# Patient Record
Sex: Female | Born: 1960 | Race: White | Hispanic: No | Marital: Single | Smoking: Former smoker
Health system: Southern US, Community
[De-identification: ages and names within clinical notes are randomized; demographics above are authoritative.]

## PROBLEM LIST (undated history)

## (undated) DIAGNOSIS — I10 Essential (primary) hypertension: Secondary | ICD-10-CM

## (undated) DIAGNOSIS — E785 Hyperlipidemia, unspecified: Secondary | ICD-10-CM

## (undated) HISTORY — DX: Essential (primary) hypertension: I10

## (undated) HISTORY — PX: OTHER SURGICAL HISTORY: SHX169

## (undated) HISTORY — DX: Hyperlipidemia, unspecified: E78.5

---

## 1997-10-22 ENCOUNTER — Emergency Department (HOSPITAL_COMMUNITY): Admission: EM | Admit: 1997-10-22 | Discharge: 1997-10-22 | Payer: Self-pay | Admitting: Emergency Medicine

## 1997-10-23 ENCOUNTER — Emergency Department (HOSPITAL_COMMUNITY): Admission: EM | Admit: 1997-10-23 | Discharge: 1997-10-23 | Payer: Self-pay | Admitting: Emergency Medicine

## 1998-05-31 ENCOUNTER — Emergency Department (HOSPITAL_COMMUNITY): Admission: EM | Admit: 1998-05-31 | Discharge: 1998-05-31 | Payer: Self-pay | Admitting: Emergency Medicine

## 1999-09-20 ENCOUNTER — Encounter: Admission: RE | Admit: 1999-09-20 | Discharge: 1999-09-20 | Payer: Self-pay | Admitting: Obstetrics

## 1999-10-22 ENCOUNTER — Other Ambulatory Visit: Admission: RE | Admit: 1999-10-22 | Discharge: 1999-10-22 | Payer: Self-pay | Admitting: Obstetrics

## 1999-10-22 ENCOUNTER — Encounter (INDEPENDENT_AMBULATORY_CARE_PROVIDER_SITE_OTHER): Payer: Self-pay | Admitting: Specialist

## 1999-12-06 ENCOUNTER — Encounter: Admission: RE | Admit: 1999-12-06 | Discharge: 1999-12-06 | Payer: Self-pay | Admitting: Obstetrics

## 2000-02-26 ENCOUNTER — Emergency Department (HOSPITAL_COMMUNITY): Admission: EM | Admit: 2000-02-26 | Discharge: 2000-02-26 | Payer: Self-pay | Admitting: Emergency Medicine

## 2001-05-20 ENCOUNTER — Encounter: Payer: Self-pay | Admitting: Emergency Medicine

## 2001-05-20 ENCOUNTER — Emergency Department (HOSPITAL_COMMUNITY): Admission: EM | Admit: 2001-05-20 | Discharge: 2001-05-20 | Payer: Self-pay | Admitting: Emergency Medicine

## 2001-10-14 ENCOUNTER — Ambulatory Visit (HOSPITAL_COMMUNITY): Admission: RE | Admit: 2001-10-14 | Discharge: 2001-10-14 | Payer: Self-pay | Admitting: Family Medicine

## 2001-10-14 ENCOUNTER — Encounter: Payer: Self-pay | Admitting: Family Medicine

## 2005-01-07 ENCOUNTER — Emergency Department (HOSPITAL_COMMUNITY): Admission: EM | Admit: 2005-01-07 | Discharge: 2005-01-07 | Payer: Self-pay | Admitting: Emergency Medicine

## 2005-09-03 ENCOUNTER — Ambulatory Visit: Payer: Self-pay | Admitting: Internal Medicine

## 2005-09-18 ENCOUNTER — Encounter: Admission: RE | Admit: 2005-09-18 | Discharge: 2005-09-18 | Payer: Self-pay | Admitting: Internal Medicine

## 2006-01-28 ENCOUNTER — Other Ambulatory Visit: Admission: RE | Admit: 2006-01-28 | Discharge: 2006-01-28 | Payer: Self-pay | Admitting: Obstetrics and Gynecology

## 2006-06-05 ENCOUNTER — Ambulatory Visit: Payer: Self-pay | Admitting: Internal Medicine

## 2006-06-10 ENCOUNTER — Ambulatory Visit: Payer: Self-pay | Admitting: Internal Medicine

## 2006-06-10 LAB — CONVERTED CEMR LAB
ALT: 13 units/L (ref 0–40)
Alkaline Phosphatase: 60 units/L (ref 39–117)
BUN: 12 mg/dL (ref 6–23)
Basophils Relative: 0.8 % (ref 0.0–1.0)
CO2: 30 meq/L (ref 19–32)
Calcium: 8.7 mg/dL (ref 8.4–10.5)
Cholesterol: 243 mg/dL (ref 0–200)
Creatinine, Ser: 0.7 mg/dL (ref 0.4–1.2)
Crystals: NEGATIVE
Direct LDL: 164.7 mg/dL
Eosinophils Relative: 1.6 % (ref 0.0–5.0)
GFR calc Af Amer: 117 mL/min
Glucose, Bld: 104 mg/dL — ABNORMAL HIGH (ref 70–99)
HCT: 39.9 % (ref 36.0–46.0)
Hemoglobin, Urine: NEGATIVE
Hemoglobin: 14.2 g/dL (ref 12.0–15.0)
Ketones, ur: NEGATIVE mg/dL
Leukocytes, UA: NEGATIVE
Lymphocytes Relative: 35 % (ref 12.0–46.0)
Monocytes Absolute: 0.4 10*3/uL (ref 0.2–0.7)
Monocytes Relative: 6.1 % (ref 3.0–11.0)
Mucus, UA: NEGATIVE
Neutro Abs: 3.7 10*3/uL (ref 1.4–7.7)
Neutrophils Relative %: 56.5 % (ref 43.0–77.0)
Potassium: 4.2 meq/L (ref 3.5–5.1)
RDW: 11.7 % (ref 11.5–14.6)
TSH: 1.31 microintl units/mL (ref 0.35–5.50)
Total Protein, Urine: NEGATIVE mg/dL
Urine Glucose: NEGATIVE mg/dL
Urobilinogen, UA: 0.2 (ref 0.0–1.0)
VLDL: 28 mg/dL (ref 0–40)
WBC: 6.6 10*3/uL (ref 4.5–10.5)

## 2006-06-13 ENCOUNTER — Emergency Department (HOSPITAL_COMMUNITY): Admission: EM | Admit: 2006-06-13 | Discharge: 2006-06-13 | Payer: Self-pay | Admitting: Emergency Medicine

## 2006-06-17 ENCOUNTER — Ambulatory Visit: Payer: Self-pay | Admitting: Internal Medicine

## 2006-08-28 ENCOUNTER — Emergency Department (HOSPITAL_COMMUNITY): Admission: EM | Admit: 2006-08-28 | Discharge: 2006-08-29 | Payer: Self-pay | Admitting: Emergency Medicine

## 2006-09-15 ENCOUNTER — Ambulatory Visit: Payer: Self-pay | Admitting: Internal Medicine

## 2006-09-16 ENCOUNTER — Ambulatory Visit: Payer: Self-pay | Admitting: Internal Medicine

## 2006-09-16 LAB — CONVERTED CEMR LAB
AST: 15 units/L (ref 0–37)
Cholesterol: 229 mg/dL (ref 0–200)
Direct LDL: 153.8 mg/dL
Total CHOL/HDL Ratio: 4.2

## 2006-10-21 ENCOUNTER — Ambulatory Visit (HOSPITAL_COMMUNITY): Admission: RE | Admit: 2006-10-21 | Discharge: 2006-10-21 | Payer: Self-pay | Admitting: Internal Medicine

## 2007-07-28 ENCOUNTER — Telehealth: Payer: Self-pay | Admitting: Internal Medicine

## 2007-09-17 ENCOUNTER — Ambulatory Visit: Payer: Self-pay | Admitting: Endocrinology

## 2007-09-17 DIAGNOSIS — M545 Low back pain: Secondary | ICD-10-CM

## 2007-09-17 DIAGNOSIS — J309 Allergic rhinitis, unspecified: Secondary | ICD-10-CM | POA: Insufficient documentation

## 2007-10-13 ENCOUNTER — Encounter: Payer: Self-pay | Admitting: Internal Medicine

## 2007-11-30 ENCOUNTER — Telehealth: Payer: Self-pay | Admitting: Internal Medicine

## 2007-12-08 ENCOUNTER — Ambulatory Visit (HOSPITAL_COMMUNITY): Admission: RE | Admit: 2007-12-08 | Discharge: 2007-12-08 | Payer: Self-pay | Admitting: Internal Medicine

## 2009-03-27 ENCOUNTER — Ambulatory Visit (HOSPITAL_COMMUNITY): Admission: RE | Admit: 2009-03-27 | Discharge: 2009-03-27 | Payer: Self-pay | Admitting: Internal Medicine

## 2010-12-12 ENCOUNTER — Other Ambulatory Visit (HOSPITAL_COMMUNITY): Payer: Self-pay | Admitting: Family Medicine

## 2010-12-12 DIAGNOSIS — Z1231 Encounter for screening mammogram for malignant neoplasm of breast: Secondary | ICD-10-CM

## 2010-12-20 ENCOUNTER — Ambulatory Visit (HOSPITAL_COMMUNITY): Payer: Self-pay

## 2010-12-24 ENCOUNTER — Ambulatory Visit (HOSPITAL_COMMUNITY)
Admission: RE | Admit: 2010-12-24 | Discharge: 2010-12-24 | Disposition: A | Discharge status: Home / Self Care | Payer: BC Managed Care – PPO | Source: Ambulatory Visit | Attending: Family Medicine | Admitting: Family Medicine

## 2010-12-24 DIAGNOSIS — Z1231 Encounter for screening mammogram for malignant neoplasm of breast: Secondary | ICD-10-CM | POA: Insufficient documentation

## 2010-12-27 ENCOUNTER — Other Ambulatory Visit: Payer: Self-pay | Admitting: Family Medicine

## 2010-12-27 DIAGNOSIS — R928 Other abnormal and inconclusive findings on diagnostic imaging of breast: Secondary | ICD-10-CM

## 2011-01-22 ENCOUNTER — Ambulatory Visit
Admission: RE | Admit: 2011-01-22 | Discharge: 2011-01-22 | Disposition: A | Discharge status: Home / Self Care | Payer: BC Managed Care – PPO | Source: Ambulatory Visit | Attending: Family Medicine | Admitting: Family Medicine

## 2011-01-22 DIAGNOSIS — R928 Other abnormal and inconclusive findings on diagnostic imaging of breast: Secondary | ICD-10-CM

## 2012-03-30 ENCOUNTER — Other Ambulatory Visit: Payer: Self-pay | Admitting: Radiation Therapy

## 2012-10-27 ENCOUNTER — Other Ambulatory Visit (HOSPITAL_COMMUNITY): Payer: Self-pay | Admitting: Family Medicine

## 2012-10-27 DIAGNOSIS — Z1231 Encounter for screening mammogram for malignant neoplasm of breast: Secondary | ICD-10-CM

## 2012-11-04 ENCOUNTER — Ambulatory Visit (HOSPITAL_COMMUNITY)
Admission: RE | Admit: 2012-11-04 | Discharge: 2012-11-04 | Disposition: A | Discharge status: Home / Self Care | Payer: BC Managed Care – PPO | Source: Ambulatory Visit | Attending: Family Medicine | Admitting: Family Medicine

## 2012-11-04 DIAGNOSIS — Z1231 Encounter for screening mammogram for malignant neoplasm of breast: Secondary | ICD-10-CM | POA: Insufficient documentation

## 2015-02-21 ENCOUNTER — Other Ambulatory Visit (HOSPITAL_COMMUNITY): Payer: Self-pay | Admitting: *Deleted

## 2015-02-21 DIAGNOSIS — N644 Mastodynia: Secondary | ICD-10-CM

## 2015-03-16 ENCOUNTER — Ambulatory Visit (HOSPITAL_COMMUNITY)
Admission: RE | Admit: 2015-03-16 | Discharge: 2015-03-16 | Disposition: A | Discharge status: Home / Self Care | Payer: Self-pay | Source: Ambulatory Visit | Attending: Obstetrics and Gynecology | Admitting: Obstetrics and Gynecology

## 2015-03-16 ENCOUNTER — Ambulatory Visit
Admission: RE | Admit: 2015-03-16 | Discharge: 2015-03-16 | Disposition: A | Discharge status: Home / Self Care | Payer: No Typology Code available for payment source | Source: Ambulatory Visit | Attending: Obstetrics and Gynecology | Admitting: Obstetrics and Gynecology

## 2015-03-16 ENCOUNTER — Encounter (HOSPITAL_COMMUNITY): Payer: Self-pay

## 2015-03-16 VITALS — BP 120/76 | Temp 98.2°F | Ht 62.0 in | Wt 168.0 lb

## 2015-03-16 DIAGNOSIS — Z1239 Encounter for other screening for malignant neoplasm of breast: Secondary | ICD-10-CM

## 2015-03-16 DIAGNOSIS — N644 Mastodynia: Secondary | ICD-10-CM

## 2015-03-16 NOTE — Progress Notes (Signed)
Complaints of tingling in left outer breast x 5-6 months that patient states comes and goes.  Pap Smear:  Pap smear not completed today. Last Pap smear was in August 2015 at Dr. Rebecka ApleyMysinger's office and normal per patient. Per patient has a history of an abnormal Pap smear 10 years that required a colposcopy for follow-up. Patient states she has had at least 3 normal Pap smears since colposcopy. No Pap smear results in EPIC.  Physical exam: Breasts Left breast slightly larger than right breast. No skin abnormalities bilateral breasts. No nipple retraction bilateral breasts. No nipple discharge bilateral breasts. No lymphadenopathy. No lumps palpated bilateral breasts. Complaints of left outer breast tenderness on exam. [facility]. Appointment scheduled for Thursday, March 16, 2015 at 1550.       Pelvic/Bimanual No Pap smear completed today since last Pap smear was in August 2015 per patient. Pap smear not indicated per BCCCP guidelines.

## 2015-03-16 NOTE — Patient Instructions (Signed)
Educational materials on self breast awareness given. Explained to  Stacy Hatfield that she did not need a Pap smear today due to last Pap smear was in August 2015 per patient. Let her know BCCCP will cover Pap smears every 3 years unless has a history of abnormal Pap smears. [facility]. Appointment scheduled for Thursday, March 16, 2015 at 1550. Patient aware of appointment and will be there.  Stacy Hatfield verbalized understanding.  Anya Murphey, Kathaleen Maserhristine Poll, RN 3:59 PM

## 2016-07-03 ENCOUNTER — Emergency Department (HOSPITAL_COMMUNITY)
Admission: EM | Admit: 2016-07-03 | Discharge: 2016-07-03 | Disposition: A | Discharge status: Home / Self Care | Payer: No Typology Code available for payment source | Attending: Emergency Medicine | Admitting: Emergency Medicine

## 2016-07-03 ENCOUNTER — Encounter (HOSPITAL_COMMUNITY): Payer: Self-pay | Admitting: Emergency Medicine

## 2016-07-03 DIAGNOSIS — Y92410 Unspecified street and highway as the place of occurrence of the external cause: Secondary | ICD-10-CM | POA: Insufficient documentation

## 2016-07-03 DIAGNOSIS — M545 Low back pain: Secondary | ICD-10-CM | POA: Insufficient documentation

## 2016-07-03 DIAGNOSIS — Y999 Unspecified external cause status: Secondary | ICD-10-CM | POA: Insufficient documentation

## 2016-07-03 DIAGNOSIS — M25512 Pain in left shoulder: Secondary | ICD-10-CM | POA: Insufficient documentation

## 2016-07-03 DIAGNOSIS — Y939 Activity, unspecified: Secondary | ICD-10-CM | POA: Diagnosis not present

## 2016-07-03 DIAGNOSIS — M7918 Myalgia, other site: Secondary | ICD-10-CM

## 2016-07-03 MED ORDER — NAPROXEN 500 MG PO TABS: 500.0000 mg | ORAL_TABLET | Freq: Two times a day (BID) | ORAL | 0 refills | Status: DC

## 2016-07-03 MED ORDER — METHOCARBAMOL 500 MG PO TABS
500.0000 mg | ORAL_TABLET | Freq: Once | ORAL | Status: AC
  Administered 2016-07-03: 500 mg via ORAL
  Filled 2016-07-03: qty 1

## 2016-07-03 MED ORDER — METHOCARBAMOL 500 MG PO TABS: 500.0000 mg | ORAL_TABLET | Freq: Two times a day (BID) | ORAL | 0 refills | Status: DC

## 2016-07-03 MED ORDER — IBUPROFEN 200 MG PO TABS
600.0000 mg | ORAL_TABLET | Freq: Once | ORAL | Status: AC
  Administered 2016-07-03: 600 mg via ORAL
  Filled 2016-07-03: qty 3
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
## 2016-07-03 NOTE — ED Notes (Signed)
Pt ambulatory and independent at discharge.  Verbalized understanding of discharge instructions 

## 2016-07-07 ENCOUNTER — Emergency Department (HOSPITAL_COMMUNITY): Payer: No Typology Code available for payment source

## 2016-07-07 ENCOUNTER — Encounter (HOSPITAL_COMMUNITY): Payer: Self-pay | Admitting: *Deleted

## 2016-07-07 ENCOUNTER — Emergency Department (HOSPITAL_COMMUNITY)
Admission: EM | Admit: 2016-07-07 | Discharge: 2016-07-07 | Disposition: A | Discharge status: Home / Self Care | Payer: No Typology Code available for payment source | Attending: Emergency Medicine | Admitting: Emergency Medicine

## 2016-07-07 DIAGNOSIS — G44209 Tension-type headache, unspecified, not intractable: Secondary | ICD-10-CM | POA: Diagnosis not present

## 2016-07-07 DIAGNOSIS — H8111 Benign paroxysmal vertigo, right ear: Secondary | ICD-10-CM | POA: Insufficient documentation

## 2016-07-07 DIAGNOSIS — Z79899 Other long term (current) drug therapy: Secondary | ICD-10-CM | POA: Insufficient documentation

## 2016-07-07 DIAGNOSIS — M542 Cervicalgia: Secondary | ICD-10-CM | POA: Insufficient documentation

## 2016-07-07 DIAGNOSIS — M545 Low back pain, unspecified: Secondary | ICD-10-CM

## 2016-07-07 DIAGNOSIS — M546 Pain in thoracic spine: Secondary | ICD-10-CM | POA: Diagnosis not present

## 2016-07-07 DIAGNOSIS — Y939 Activity, unspecified: Secondary | ICD-10-CM | POA: Diagnosis not present

## 2016-07-07 DIAGNOSIS — R52 Pain, unspecified: Secondary | ICD-10-CM

## 2016-07-07 DIAGNOSIS — Y92410 Unspecified street and highway as the place of occurrence of the external cause: Secondary | ICD-10-CM | POA: Diagnosis not present

## 2016-07-07 DIAGNOSIS — Y999 Unspecified external cause status: Secondary | ICD-10-CM | POA: Diagnosis not present

## 2016-07-07 MED ORDER — MECLIZINE HCL 32 MG PO TABS: 32.0000 mg | ORAL_TABLET | Freq: Three times a day (TID) | ORAL | 0 refills | Status: DC | PRN
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
## 2017-01-08 ENCOUNTER — Ambulatory Visit: Payer: Self-pay | Admitting: Internal Medicine

## 2017-04-24 ENCOUNTER — Emergency Department (HOSPITAL_COMMUNITY)
Admission: EM | Admit: 2017-04-24 | Discharge: 2017-04-24 | Disposition: A | Discharge status: Home / Self Care | Payer: Self-pay | Attending: Emergency Medicine | Admitting: Emergency Medicine

## 2017-04-24 ENCOUNTER — Other Ambulatory Visit: Payer: Self-pay

## 2017-04-24 ENCOUNTER — Encounter (HOSPITAL_COMMUNITY): Payer: Self-pay | Admitting: Emergency Medicine

## 2017-04-24 DIAGNOSIS — Y929 Unspecified place or not applicable: Secondary | ICD-10-CM | POA: Insufficient documentation

## 2017-04-24 DIAGNOSIS — S0502XA Injury of conjunctiva and corneal abrasion without foreign body, left eye, initial encounter: Secondary | ICD-10-CM | POA: Insufficient documentation

## 2017-04-24 DIAGNOSIS — Y939 Activity, unspecified: Secondary | ICD-10-CM | POA: Insufficient documentation

## 2017-04-24 DIAGNOSIS — Y999 Unspecified external cause status: Secondary | ICD-10-CM | POA: Insufficient documentation

## 2017-04-24 DIAGNOSIS — W500XXA Accidental hit or strike by another person, initial encounter: Secondary | ICD-10-CM | POA: Insufficient documentation

## 2017-04-24 MED ORDER — TETRACAINE HCL 0.5 % OP SOLN
OPHTHALMIC | Status: AC
  Administered 2017-04-24: 04:00:00
  Filled 2017-04-24: qty 4

## 2017-04-24 MED ORDER — FLUORESCEIN SODIUM 1 MG OP STRP
ORAL_STRIP | OPHTHALMIC | Status: AC
  Administered 2017-04-24: 04:00:00
  Filled 2017-04-24: qty 1

## 2017-04-24 MED ORDER — ERYTHROMYCIN 5 MG/GM OP OINT: TOPICAL_OINTMENT | OPHTHALMIC | 0 refills | Status: DC

## 2017-04-24 MED ORDER — IBUPROFEN 200 MG PO TABS
600.0000 mg | ORAL_TABLET | Freq: Once | ORAL | Status: AC
  Administered 2017-04-24: 600 mg via ORAL
  Filled 2017-04-24: qty 3

## 2017-04-24 NOTE — Discharge Instructions (Signed)
You do have a signs of an abrasion on your eye.  Use antibiotic ointment as prescribed.  Warm compresses.  Motrin and Tylenol for pain.  Follow-up with the ophthalmologist on Monday if symptoms not improving.  Return to the ED with any worsening symptoms.

## 2017-04-24 NOTE — ED Notes (Signed)
Visual acuity: both eyes:20/25, left eye:20/30, right eye:20/30. Pt. Has no corrective lenses.
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
## 2019-04-08 ENCOUNTER — Emergency Department (HOSPITAL_COMMUNITY)
Admission: EM | Admit: 2019-04-08 | Discharge: 2019-04-08 | Disposition: A | Discharge status: Home / Self Care | Payer: Self-pay | Attending: Emergency Medicine | Admitting: Emergency Medicine

## 2019-04-08 ENCOUNTER — Emergency Department (HOSPITAL_COMMUNITY): Payer: Self-pay

## 2019-04-08 ENCOUNTER — Encounter (HOSPITAL_COMMUNITY): Payer: Self-pay

## 2019-04-08 ENCOUNTER — Other Ambulatory Visit: Payer: Self-pay

## 2019-04-08 DIAGNOSIS — Z79899 Other long term (current) drug therapy: Secondary | ICD-10-CM | POA: Insufficient documentation

## 2019-04-08 DIAGNOSIS — Z791 Long term (current) use of non-steroidal anti-inflammatories (NSAID): Secondary | ICD-10-CM | POA: Insufficient documentation

## 2019-04-08 DIAGNOSIS — R6889 Other general symptoms and signs: Secondary | ICD-10-CM

## 2019-04-08 DIAGNOSIS — U071 COVID-19: Secondary | ICD-10-CM | POA: Insufficient documentation

## 2019-04-08 LAB — BASIC METABOLIC PANEL
Anion gap: 10 (ref 5–15)
BUN: 12 mg/dL (ref 6–20)
CO2: 28 mmol/L (ref 22–32)
Calcium: 8.8 mg/dL — ABNORMAL LOW (ref 8.9–10.3)
Chloride: 103 mmol/L (ref 98–111)
Creatinine, Ser: 0.64 mg/dL (ref 0.44–1.00)
GFR calc Af Amer: >60 mL/min (ref >60)
GFR calc non Af Amer: >60 mL/min (ref >60)
Glucose, Bld: 97 mg/dL (ref 70–99)
Potassium: 4.5 mmol/L (ref 3.5–5.1)
Sodium: 141 mmol/L (ref 135–145)

## 2019-04-08 LAB — CBC WITH DIFFERENTIAL/PLATELET
Abs Immature Granulocytes: 0.01 K/uL (ref 0.00–0.07)
Basophils Absolute: 0 K/uL (ref 0.0–0.1)
Basophils Relative: 1 %
Eosinophils Absolute: 0 K/uL (ref 0.0–0.5)
Eosinophils Relative: 0 %
HCT: 44.8 % (ref 36.0–46.0)
Hemoglobin: 14.2 g/dL (ref 12.0–15.0)
Immature Granulocytes: 0 %
Lymphocytes Relative: 35 %
Lymphs Abs: 1.5 K/uL (ref 0.7–4.0)
MCH: 30.5 pg (ref 26.0–34.0)
MCHC: 31.7 g/dL (ref 30.0–36.0)
MCV: 96.1 fL (ref 80.0–100.0)
Monocytes Absolute: 0.6 K/uL (ref 0.1–1.0)
Monocytes Relative: 14 %
Neutro Abs: 2.2 K/uL (ref 1.7–7.7)
Neutrophils Relative %: 50 %
Platelets: 201 K/uL (ref 150–400)
RBC: 4.66 MIL/uL (ref 3.87–5.11)
RDW: 12.7 % (ref 11.5–15.5)
WBC: 4.3 K/uL (ref 4.0–10.5)
nRBC: 0 % (ref 0.0–0.2)

## 2019-04-08 MED ORDER — ACETAMINOPHEN 500 MG PO TABS
1000.0000 mg | ORAL_TABLET | Freq: Once | ORAL | Status: AC
  Administered 2019-04-08: 1000 mg via ORAL
  Filled 2019-04-08: qty 2

## 2019-04-08 NOTE — ED Provider Notes (Signed)
[facility]-EMERGENCY DEPT Provider Note   CSN: [id number] Arrival date & time: 04/08/19  0944     History   Chief Complaint Chief Complaint  Patient presents with  . Generalized Body Aches  . Fever    [name] is a 58 y.o. female.     The history is provided by the patient.  Fever Temp source:  Subjective Severity:  Moderate Onset quality:  Gradual Timing:  Intermittent Progression:  Waxing and waning Chronicity:  New Relieved by:  Acetaminophen Worsened by:  Nothing Associated symptoms: chills and myalgias   Associated symptoms: no chest pain, no cough, no dysuria, no ear pain, no rash, no sore throat and no vomiting   Risk factors: no sick contacts     History reviewed. No pertinent past medical history.  Patient Active Problem List   Diagnosis Date Noted  . ALLERGIC RHINITIS CAUSE UNSPECIFIED 09/17/2007  . BACK PAIN, LUMBAR 09/17/2007    History reviewed. No pertinent surgical history.   OB History    Gravida  1   Para  1   Term  1   Preterm      AB      Living  1     SAB      TAB      Ectopic      Multiple      Live Births               Home Medications    Prior to Admission medications   Medication Sig Start Date End Date Taking? Authorizing Provider  erythromycin ophthalmic ointment Place a 1/2 inch ribbon of ointment into the lower eyelid 4 times per day. 04/24/17   Rise Mu, PA-C  meclizine (ANTIVERT) 32 MG tablet Take 1 tablet (32 mg total) by mouth 3 (three) times daily as needed. 07/07/16   Georgiana Shore, PA-C  methocarbamol (ROBAXIN) 500 MG tablet Take 1 tablet (500 mg total) by mouth 2 (two) times daily. 07/03/16   Audry Pili, PA-C  naproxen (NAPROSYN) 500 MG tablet Take 1 tablet (500 mg total) by mouth 2 (two) times daily. 07/03/16   Audry Pili, PA-C    Family History Family History  Problem Relation Age of Onset  . Hypertension Mother   . Stroke Father     Social  History Social History   Tobacco Use  . Smoking status: Never Smoker  . Smokeless tobacco: Never Used  Substance Use Topics  . Alcohol use: No  . Drug use: No     Allergies   Patient has no known allergies.   Review of Systems Review of Systems  Constitutional: Positive for chills and fever.  HENT: Negative for ear pain and sore throat.   Eyes: Negative for pain and visual disturbance.  Respiratory: Negative for cough and shortness of breath.   Cardiovascular: Negative for chest pain and palpitations.  Gastrointestinal: Negative for abdominal pain and vomiting.  Genitourinary: Negative for dysuria and hematuria.  Musculoskeletal: Positive for myalgias. Negative for arthralgias and back pain.  Skin: Negative for color change and rash.  Neurological: Negative for seizures and syncope.  All other systems reviewed and are negative.    Physical Exam Updated Vital Signs  ED Triage Vitals  Enc Vitals Group     BP 04/08/19 0949 (!) 154/86     Pulse Rate 04/08/19 0949 78     Resp 04/08/19 0949 18     Temp 04/08/19 0949 100 F (37.8  C)     Temp Source 04/08/19 0949 Oral     SpO2 04/08/19 0949 100 %     Weight 04/08/19 1000 165 lb (74.8 kg)     Height 04/08/19 1000 5\' 1"  (1.549 m)     Head Circumference --      Peak Flow --      Pain Score 04/08/19 1000 8     Pain Loc --      Pain Edu? --      Excl. in GC? --     Physical Exam Vitals signs and nursing note reviewed.  Constitutional:      General: She is not in acute distress.    Appearance: She is well-developed. She is not ill-appearing.  HENT:     Head: Normocephalic and atraumatic.     Nose: Nose normal.     Mouth/Throat:     Mouth: Mucous membranes are moist.  Eyes:     Extraocular Movements: Extraocular movements intact.     Conjunctiva/sclera: Conjunctivae normal.     Pupils: Pupils are equal, round, and reactive to light.  Neck:     Musculoskeletal: Normal range of motion and neck supple.   Cardiovascular:     Rate and Rhythm: Normal rate and regular rhythm.     Pulses: Normal pulses.     Heart sounds: No murmur.  Pulmonary:     Effort: Pulmonary effort is normal. No respiratory distress.  Abdominal:     Palpations: Abdomen is soft.     Tenderness: There is no abdominal tenderness.  Musculoskeletal: Normal range of motion.        General: No tenderness.  Skin:    General: Skin is warm and dry.     Capillary Refill: Capillary refill takes less than 2 seconds.  Neurological:     General: No focal deficit present.     Mental Status: She is alert.  Psychiatric:        Mood and Affect: Mood normal.      ED Treatments / Results  Labs (all labs ordered are listed, but only abnormal results are displayed) Labs Reviewed  BASIC METABOLIC PANEL - Abnormal; Notable for the following components:      Result Value   Calcium 8.8 (*)    All other components within normal limits  NOVEL CORONAVIRUS, NAA (HOSP ORDER, SEND-OUT TO REF LAB; TAT 18-24 HRS)  CBC WITH DIFFERENTIAL/PLATELET    EKG None  Radiology Dg Chest Portable 1 View  Result Date: 04/08/2019 CLINICAL DATA:  Fever EXAM: PORTABLE CHEST 1 VIEW COMPARISON:  Most recent imaging is from 2008 FINDINGS: The heart size and mediastinal contours are within normal limits. Both lungs are clear. No pleural effusion. The visualized skeletal structures are unremarkable. IMPRESSION: No acute process in the chest. Electronically Signed   By: Guadlupe SpanishPraneil  Patel M.D.   On: 04/08/2019 11:52    Procedures Procedures (including critical care time)  Medications Ordered in ED Medications  acetaminophen (TYLENOL) tablet 1,000 mg (1,000 mg Oral Given 04/08/19 1124)     Initial Impression / Assessment and Plan / ED Course  I have reviewed the triage vital signs and the nursing notes.  Pertinent labs & imaging results that were available during my care of the patient were reviewed by me and considered in my medical decision making  (see chart for details).     Stacy Hatfield is a 58 year old female no significant medical history who presents to the ED with flulike symptoms.  Patient  with unremarkable vitals.  Low-grade temperature.  Fever on and off for the last 3 days with body aches, cough.  No urinary symptoms.  No chest pain, no shortness of breath, no abdominal pain.  Overall is well-appearing.  No significant anemia, electrolyte abnormality, kidney injury.  Suspect possible Covid.  Tested for coronavirus.  Had no signs of respiratory distress.  Chest x-ray showed no signs of pneumonia, pneumothorax, pleural effusion.  Recommend continued use of Tylenol Motrin at home.  Suspect viral process.  Understands self-isolation until coronavirus test is back.  Given strict return precautions.  Discharged in good condition.  This chart was dictated using voice recognition software.  Despite best efforts to proofread,  errors can occur which can change the documentation meaning.  Stacy Hatfield was evaluated in Emergency Department on 04/08/2019 for the symptoms described in the history of present illness. She was evaluated in the context of the global COVID-19 pandemic, which necessitated consideration that the patient might be at risk for infection with the SARS-CoV-2 virus that causes COVID-19. Institutional protocols and algorithms that pertain to the evaluation of patients at risk for COVID-19 are in a state of rapid change based on information released by regulatory bodies including the CDC and federal and state organizations. These policies and algorithms were followed during the patient's care in the ED.   Final Clinical Impressions(s) / ED Diagnoses   Final diagnoses:  Flu-like symptoms    ED Discharge Orders    None       Lennice Sites, DO 04/08/19 1228

## 2019-04-08 NOTE — ED Triage Notes (Signed)
Pt presents with c/o generalized body aches for the past 3 days. Pt is febrile today as well, unknown if she was exposed to anyone with Covid or any other illness.

## 2019-04-08 NOTE — Discharge Instructions (Signed)
Self isolate until you hear back about test.

## 2019-04-09 LAB — NOVEL CORONAVIRUS, NAA (HOSP ORDER, SEND-OUT TO REF LAB; TAT 18-24 HRS): SARS-CoV-2, NAA: DETECTED — AB

## 2020-06-01 DIAGNOSIS — Z20822 Contact with and (suspected) exposure to covid-19: Secondary | ICD-10-CM | POA: Diagnosis not present

## 2020-10-18 ENCOUNTER — Encounter: Payer: Self-pay | Admitting: Nurse Practitioner

## 2020-10-18 ENCOUNTER — Ambulatory Visit: Payer: Self-pay | Attending: Nurse Practitioner | Admitting: Nurse Practitioner

## 2020-10-18 ENCOUNTER — Other Ambulatory Visit: Payer: Self-pay

## 2020-10-18 DIAGNOSIS — Z7689 Persons encountering health services in other specified circumstances: Secondary | ICD-10-CM

## 2020-10-18 NOTE — Progress Notes (Signed)
Virtual Visit via Telephone Note Due to national recommendations of social distancing due to [place] 19, telehealth visit is felt to be most appropriate for this patient at this time.  I discussed the limitations, risks, security and privacy concerns of performing an evaluation and management service by telephone and the availability of in person appointments. I also discussed with the patient that there may be a patient responsible charge related to this service. The patient expressed understanding and agreed to proceed.    I connected with Stacy Hatfield on 10/18/20  at   1:50 PM EDT  EDT by telephone and verified that I am speaking with the correct person using two identifiers.   Consent I discussed the limitations, risks, security and privacy concerns of performing an evaluation and management service by telephone and the availability of in person appointments. I also discussed with the patient that there may be a patient responsible charge related to this service. The patient expressed understanding and agreed to proceed.   Location of Patient: Private Residence   Location of Provider: [place] participating in Telemedicine visit: Geryl Rankins FNP-BC [place] CMA Stacy Hatfield    History of Present Illness: Telemedicine visit for: Establish care History of Present Illness: Telemedicine visit for: Establish Care Patient has been counseled on age-appropriate routine health concerns for screening and prevention. These are reviewed and up-to-date. Referrals have been placed accordingly. Immunizations are up-to-date or declined.    MAMMOGRAM: overdue. Referred to breast clinic. Bilateral nipple pruritis. Denies any palpable masses/lumps PAP Smear: Overdue.  Colonoscopy: Needs stool kit.   She has no significant PMH. Wants to establish care. She has not had a PCP in several years. Denies any significant PMH of HTN, DM, Thyroid disorder.    Denies chest pain, shortness of breath, palpitations, lightheadedness, dizziness, headaches or BLE edema.     History reviewed. No pertinent past medical history.  History reviewed. No pertinent surgical history.  Family History  Problem Relation Age of Onset  . Hypertension Mother   . Stroke Father   . Heart attack Father     Social History   Socioeconomic History  . Marital status: Single    Spouse name: Not on file  . Number of children: Not on file  . Years of education: Not on file  . Highest education level: Not on file  Occupational History  . Not on file  Tobacco Use  . Smoking status: Never Smoker  . Smokeless tobacco: Never Used  Substance and Sexual Activity  . Alcohol use: No  . Drug use: No  . Sexual activity: Not on file  Other Topics Concern  . Not on file  Social History Narrative  . Not on file   Social Determinants of Health   Financial Resource Strain: Not on file  Food Insecurity: Not on file  Transportation Needs: Not on file  Physical Activity: Not on file  Stress: Not on file  Social Connections: Not on file     Observations/Objective: Awake, alert and oriented x 3   Review of Systems  Constitutional: Negative for fever, malaise/fatigue and weight loss.  HENT: Negative.  Negative for nosebleeds.   Eyes: Negative.  Negative for blurred vision, double vision and photophobia.  Respiratory: Negative.  Negative for cough and shortness of breath.   Cardiovascular: Negative.  Negative for chest pain, palpitations and leg swelling.  Gastrointestinal: Negative.  Negative for heartburn, nausea and vomiting.  Musculoskeletal: Negative.  Negative for myalgias.  Neurological: Negative.  Negative for dizziness, focal weakness, seizures and headaches.  Psychiatric/Behavioral: Negative.  Negative for suicidal ideas.    Assessment and Plan: Diagnoses and all orders for this visit:  Encounter to establish care Follow Up Instructions Return for  Physical/PAP.     I discussed the assessment and treatment plan with the patient. The patient was provided an opportunity to ask questions and all were answered. The patient agreed with the plan and demonstrated an understanding of the instructions.   The patient was advised to call back or seek an in-person evaluation if the symptoms worsen or if the condition fails to improve as anticipated.  I provided 14 minutes of non-face-to-face time during this encounter including median intraservice time, reviewing previous notes, labs, imaging, medications and explaining diagnosis and management.  Gildardo Pounds, FNP-BC

## 2020-12-01 ENCOUNTER — Ambulatory Visit: Payer: Self-pay | Attending: Nurse Practitioner | Admitting: Nurse Practitioner

## 2020-12-01 ENCOUNTER — Encounter: Payer: Self-pay | Admitting: Nurse Practitioner

## 2020-12-01 ENCOUNTER — Other Ambulatory Visit (HOSPITAL_COMMUNITY)
Admission: RE | Admit: 2020-12-01 | Discharge: 2020-12-01 | Disposition: A | Discharge status: Home / Self Care | Payer: Medicaid Other | Source: Ambulatory Visit | Attending: Nurse Practitioner | Admitting: Nurse Practitioner

## 2020-12-01 ENCOUNTER — Other Ambulatory Visit: Payer: Self-pay

## 2020-12-01 VITALS — BP 143/81 | HR 61 | Ht 61.0 in | Wt 182.0 lb

## 2020-12-01 DIAGNOSIS — Z01419 Encounter for gynecological examination (general) (routine) without abnormal findings: Secondary | ICD-10-CM | POA: Insufficient documentation

## 2020-12-01 DIAGNOSIS — Z1151 Encounter for screening for human papillomavirus (HPV): Secondary | ICD-10-CM | POA: Insufficient documentation

## 2020-12-01 DIAGNOSIS — Z124 Encounter for screening for malignant neoplasm of cervix: Secondary | ICD-10-CM | POA: Diagnosis present

## 2020-12-01 DIAGNOSIS — Z131 Encounter for screening for diabetes mellitus: Secondary | ICD-10-CM

## 2020-12-01 DIAGNOSIS — Z130 Encounter for screening for diseases of the blood and blood-forming organs and certain disorders involving the immune mechanism: Secondary | ICD-10-CM

## 2020-12-01 DIAGNOSIS — Z1211 Encounter for screening for malignant neoplasm of colon: Secondary | ICD-10-CM

## 2020-12-01 DIAGNOSIS — Z1329 Encounter for screening for other suspected endocrine disorder: Secondary | ICD-10-CM | POA: Diagnosis not present

## 2020-12-01 DIAGNOSIS — Z1159 Encounter for screening for other viral diseases: Secondary | ICD-10-CM | POA: Diagnosis not present

## 2020-12-01 DIAGNOSIS — N649 Disorder of breast, unspecified: Secondary | ICD-10-CM

## 2020-12-01 DIAGNOSIS — Z13220 Encounter for screening for lipoid disorders: Secondary | ICD-10-CM | POA: Diagnosis not present

## 2020-12-01 DIAGNOSIS — I10 Essential (primary) hypertension: Secondary | ICD-10-CM

## 2020-12-01 DIAGNOSIS — Z114 Encounter for screening for human immunodeficiency virus [HIV]: Secondary | ICD-10-CM

## 2020-12-01 DIAGNOSIS — Z1231 Encounter for screening mammogram for malignant neoplasm of breast: Secondary | ICD-10-CM

## 2020-12-01 DIAGNOSIS — Z113 Encounter for screening for infections with a predominantly sexual mode of transmission: Secondary | ICD-10-CM | POA: Insufficient documentation

## 2020-12-01 DIAGNOSIS — Z112 Encounter for screening for other bacterial diseases: Secondary | ICD-10-CM | POA: Diagnosis not present

## 2020-12-01 DIAGNOSIS — L988 Other specified disorders of the skin and subcutaneous tissue: Secondary | ICD-10-CM

## 2020-12-01 MED ORDER — LISINOPRIL 10 MG PO TABS
10.0000 mg | ORAL_TABLET | Freq: Every day | ORAL | 3 refills | Status: DC
  Filled 2020-12-01: qty 30, 30d supply, fill #0
  Filled 2020-12-19: qty 30, 30d supply, fill #1
  Filled 2021-01-31: qty 30, 30d supply, fill #2
  Filled 2021-03-01: qty 30, 30d supply, fill #3
  Filled 2021-03-29: qty 30, 30d supply, fill #4
  Filled 2021-05-10: qty 30, 30d supply, fill #5
  Filled 2021-06-07: qty 30, 30d supply, fill #0
  Filled 2021-06-07: qty 30, 30d supply, fill #6
  Filled 2021-07-04: qty 30, 30d supply, fill #1
  Filled 2021-08-01: qty 30, 30d supply, fill #2

## 2020-12-01 NOTE — Progress Notes (Addendum)
Assessment & Plan:  Stacy Hatfield was seen today for gynecologic exam.  Diagnoses and all orders for this visit:  Encounter for Papanicolaou smear for cervical cancer screening -     Cytology - PAP -     Cervicovaginal ancillary only  Breast cancer screening by mammogram -     MM DIGITAL SCREENING BILATERAL; Future  Encounter for screening for diabetes mellitus -     Hemoglobin A1c  Screening for deficiency anemia -     CBC  Thyroid disorder screening -     Thyroid Panel With TSH  Colon cancer screening -     Fecal occult blood, imunochemical([facility]/Sun[facility])  Serum calcium elevated -     CMP14+EGFR  Lipid screening -     Lipid panel  Encounter for screening for HIV -     HIV antibody (with reflex)  Need for hepatitis C screening test -     HCV Ab w Reflex to Quant PCR  Primary hypertension -     lisinopril (ZESTRIL) 10 MG tablet; Take 1 tablet (10 mg total) by mouth daily.  Skin lesion of breast -     Ambulatory referral to Dermatology   Patient has been counseled on age-appropriate routine health concerns for screening and prevention. These are reviewed and up-to-date. Referrals have been placed accordingly. Immunizations are up-to-date or declined.    Subjective:   Chief Complaint  Patient presents with   Gynecologic Exam   [name] 60 y.o. female presents to office today for wellness exam.   She has no past medical history on file.    Essential Hypertension Poorly controlled. Will start low dose lisinopril 10 mg today. Denies chest pain, shortness of breath, palpitations, lightheadedness, dizziness, headaches or BLE edema.   BP Readings from Last 3 Encounters:  12/01/20 (!) 143/81  04/08/19 (!) 155/76  04/24/17 (!) 166/82    Review of Systems  Constitutional: Negative.  Negative for chills, fever, malaise/fatigue and weight loss.  Respiratory: Negative.  Negative for cough, sputum production, shortness of breath and wheezing.    Cardiovascular: Negative.  Negative for chest pain and leg swelling.  Gastrointestinal: Negative.  Negative for abdominal pain, blood in stool, constipation, diarrhea, heartburn, melena, nausea and vomiting.  Genitourinary: Negative.   Skin: Negative.  Negative for rash.  Neurological: Negative.  Negative for dizziness, tremors, speech change, focal weakness, seizures and headaches.  Psychiatric/Behavioral: Negative.  Negative for depression and suicidal ideas. The patient is not nervous/anxious and does not have insomnia.    History reviewed. No pertinent past medical history.  History reviewed. No pertinent surgical history.  Family History  Problem Relation Age of Onset   Hypertension Mother    Stroke Father    Heart attack Father     Social History Reviewed with no changes to be made today.   No outpatient medications prior to visit.   No facility-administered medications prior to visit.    No Known Allergies     Objective:    BP (!) 143/81   Pulse 61   Ht 5\' 1"  (1.549 m)   Wt 182 lb (82.6 kg)   LMP 10/18/2012   SpO2 98%   BMI 34.39 kg/m  Wt Readings from Last 3 Encounters:  12/01/20 182 lb (82.6 kg)  04/08/19 165 lb (74.8 kg)  07/07/16 175 lb (79.4 kg)    Physical Exam Exam conducted with a chaperone present.  Constitutional:      General: She is not in acute distress.  Appearance: She is well-developed. She is not diaphoretic.  HENT:     Head: Normocephalic and atraumatic.     Right Ear: External ear normal.     Left Ear: External ear normal.     Nose: Nose normal.     Mouth/Throat:     Pharynx: No oropharyngeal exudate.  Eyes:     General: No scleral icterus.       Right eye: No discharge.        Left eye: No discharge.     Conjunctiva/sclera: Conjunctivae normal.     Pupils: Pupils are equal, round, and reactive to light.  Neck:     Thyroid: No thyromegaly.     Trachea: No tracheal deviation.  Cardiovascular:     Rate and Rhythm: Normal  rate and regular rhythm.     Heart sounds: Normal heart sounds. No murmur heard.   No friction rub.  Pulmonary:     Effort: Pulmonary effort is normal. No respiratory distress.     Breath sounds: Normal breath sounds. No decreased breath sounds, wheezing, rhonchi or rales.  Chest:     Chest wall: No tenderness.  Breasts:    Right: No inverted nipple, mass, nipple discharge, skin change or tenderness.     Left: No inverted nipple, mass, nipple discharge, skin change or tenderness.    Abdominal:     General: Bowel sounds are normal. There is no distension.     Palpations: Abdomen is soft. There is no mass.     Tenderness: There is no abdominal tenderness. There is no guarding or rebound.     Hernia: There is no hernia in the left inguinal area.  Genitourinary:    Exam position: Lithotomy position.     Labia:        Right: No rash, tenderness, lesion or injury.        Left: No rash, tenderness, lesion or injury.      Vagina: Normal. No vaginal discharge, erythema, tenderness or bleeding.     Cervix: No cervical motion tenderness, discharge or friability.     Uterus: Not tender.      Adnexa:        Right: No mass, tenderness or fullness.         Left: No mass, tenderness or fullness.       Rectum: No mass, anal fissure or external hemorrhoid. Normal anal tone.  Musculoskeletal:        General: No tenderness or deformity. Normal range of motion.     Cervical back: Normal range of motion and neck supple.  Lymphadenopathy:     Cervical: No cervical adenopathy.  Skin:    General: Skin is warm and dry.     Coloration: Skin is not pale.     Findings: No erythema or rash.  Neurological:     Mental Status: She is alert and oriented to person, place, and time.     Cranial Nerves: No cranial nerve deficit.     Coordination: Coordination normal.  Psychiatric:        Behavior: Behavior normal.        Thought Content: Thought content normal.        Judgment: Judgment normal.          Patient has been counseled extensively about nutrition and exercise as well as the importance of adherence with medications and regular follow-up. The patient was given clear instructions to go to ER or return to medical center if symptoms don't improve,  worsen or new problems develop. The patient verbalized understanding.   Follow-up: Return in about 3 weeks (around 12/22/2020) for BP and BMP with LUKE. see me in 3 months.   Gildardo Pounds, FNP-BC [facility] [place], [place]   12/01/2020, 3:13 PM

## 2020-12-02 LAB — CMP14+EGFR
ALT: 16 IU/L (ref 0–32)
AST: 17 IU/L (ref 0–40)
Albumin/Globulin Ratio: 1.9 (ref 1.2–2.2)
Albumin: 4.5 g/dL (ref 3.8–4.9)
Alkaline Phosphatase: 104 IU/L (ref 44–121)
BUN/Creatinine Ratio: 17 (ref 9–23)
BUN: 11 mg/dL (ref 6–24)
Bilirubin Total: 0.2 mg/dL (ref 0.0–1.2)
CO2: 25 mmol/L (ref 20–29)
Calcium: 9.4 mg/dL (ref 8.7–10.2)
Chloride: 103 mmol/L (ref 96–106)
Creatinine, Ser: 0.63 mg/dL (ref 0.57–1.00)
Globulin, Total: 2.4 g/dL (ref 1.5–4.5)
Glucose: 93 mg/dL (ref 65–99)
Potassium: 4.4 mmol/L (ref 3.5–5.2)
Sodium: 144 mmol/L (ref 134–144)
Total Protein: 6.9 g/dL (ref 6.0–8.5)
eGFR: 103 mL/min/{1.73_m2} (ref >59)

## 2020-12-02 LAB — CBC
Hematocrit: 38.9 % (ref 34.0–46.6)
Hemoglobin: 13.3 g/dL (ref 11.1–15.9)
MCH: 30.9 pg (ref 26.6–33.0)
MCHC: 34.2 g/dL (ref 31.5–35.7)
MCV: 91 fL (ref 79–97)
Platelets: 225 10*3/uL (ref 150–450)
RBC: 4.3 x10E6/uL (ref 3.77–5.28)
RDW: 12.6 % (ref 11.7–15.4)
WBC: 7.3 10*3/uL (ref 3.4–10.8)

## 2020-12-02 LAB — LIPID PANEL
Chol/HDL Ratio: 4.6 ratio — ABNORMAL HIGH (ref 0.0–4.4)
Cholesterol, Total: 255 mg/dL — ABNORMAL HIGH (ref 100–199)
HDL: 56 mg/dL (ref >39)
LDL Chol Calc (NIH): 168 mg/dL — ABNORMAL HIGH (ref 0–99)
Triglycerides: 168 mg/dL — ABNORMAL HIGH (ref 0–149)
VLDL Cholesterol Cal: 31 mg/dL (ref 5–40)

## 2020-12-02 LAB — HCV INTERPRETATION

## 2020-12-02 LAB — THYROID PANEL WITH TSH
Free Thyroxine Index: 2.3 (ref 1.2–4.9)
T3 Uptake Ratio: 31 % (ref 24–39)
T4, Total: 7.5 ug/dL (ref 4.5–12.0)
TSH: 1.18 u[IU]/mL (ref 0.450–4.500)

## 2020-12-02 LAB — HEMOGLOBIN A1C
Est. average glucose Bld gHb Est-mCnc: 123 mg/dL
Hgb A1c MFr Bld: 5.9 % — ABNORMAL HIGH (ref 4.8–5.6)

## 2020-12-02 LAB — HCV AB W REFLEX TO QUANT PCR: HCV Ab: <0.1 s/co ratio (ref 0.0–0.9)

## 2020-12-02 LAB — HIV ANTIBODY (ROUTINE TESTING W REFLEX): HIV Screen 4th Generation wRfx: NONREACTIVE

## 2020-12-04 ENCOUNTER — Other Ambulatory Visit: Payer: Self-pay | Admitting: Nurse Practitioner

## 2020-12-04 LAB — CERVICOVAGINAL ANCILLARY ONLY
Bacterial Vaginitis (gardnerella): POSITIVE — AB
Candida Glabrata: NEGATIVE
Candida Vaginitis: NEGATIVE
Chlamydia: NEGATIVE
Comment: NEGATIVE
Comment: NEGATIVE
Comment: NEGATIVE
Comment: NEGATIVE
Comment: NEGATIVE
Comment: NORMAL
Neisseria Gonorrhea: NEGATIVE
Trichomonas: NEGATIVE

## 2020-12-04 MED ORDER — METRONIDAZOLE 500 MG PO TABS: 500.0000 mg | ORAL_TABLET | Freq: Two times a day (BID) | ORAL | 0 refills | Status: AC

## 2020-12-06 ENCOUNTER — Other Ambulatory Visit: Payer: Self-pay | Admitting: Obstetrics and Gynecology

## 2020-12-06 DIAGNOSIS — Z1231 Encounter for screening mammogram for malignant neoplasm of breast: Secondary | ICD-10-CM

## 2020-12-06 LAB — CYTOLOGY - PAP
Comment: NEGATIVE
Diagnosis: NEGATIVE
High risk HPV: NEGATIVE

## 2020-12-07 LAB — FECAL OCCULT BLOOD, IMMUNOCHEMICAL: Fecal Occult Bld: NEGATIVE

## 2020-12-08 ENCOUNTER — Other Ambulatory Visit: Payer: Self-pay

## 2020-12-08 ENCOUNTER — Ambulatory Visit: Payer: Self-pay | Attending: Nurse Practitioner

## 2020-12-19 ENCOUNTER — Ambulatory Visit: Payer: Medicaid Other | Attending: Nurse Practitioner | Admitting: Pharmacist

## 2020-12-19 ENCOUNTER — Other Ambulatory Visit: Payer: Self-pay

## 2020-12-19 ENCOUNTER — Ambulatory Visit: Payer: Medicaid Other

## 2020-12-19 VITALS — BP 118/71

## 2020-12-19 DIAGNOSIS — I10 Essential (primary) hypertension: Secondary | ICD-10-CM

## 2020-12-19 NOTE — Progress Notes (Signed)
S:    Patient arrives in good spirits. Presents to the clinic for hypertension evaluation, counseling, and management. Patient was referred and last seen by Primary Care Provider on 12/01/2020. At that visit, Stacy Hatfield started lisinopril 10 mg daily.    Medication adherence reported. She has not taken her lisinopril today as she normally takes her medication at bedtime. She did take it last night.   Denies chest pain/dyspnea/HA/dizziness.   Current BP Medications include:  lisinopril 10 mg daily   Dietary habits include: does admit to getting too much sodium in her diet; admits to drinking caffeine daily  Exercise habits include: walks ~1-1.5 miles/3x weekly (usually takes her ~30 minutes) Family / Social history:  -Fhx:  -Tobacco: never smoker  -Alcohol: denies use  O:  Vitals:   12/19/20 1556  BP: 118/71    Home BP readings: none  Last 3 Office BP readings: BP Readings from Last 3 Encounters:  12/19/20 118/71  12/01/20 (!) 143/81  04/08/19 (!) 155/76   BMET    Component Value Date/Time   NA 144 12/01/2020 1510   K 4.4 12/01/2020 1510   CL 103 12/01/2020 1510   CO2 25 12/01/2020 1510   GLUCOSE 93 12/01/2020 1510   GLUCOSE 97 04/08/2019 1116   BUN 11 12/01/2020 1510   CREATININE 0.63 12/01/2020 1510   CALCIUM 9.4 12/01/2020 1510   GFRNONAA >60 04/08/2019 1116   GFRAA >60 04/08/2019 1116   Renal function: CrCl cannot be calculated (Unknown ideal weight.).  Clinical ASCVD: No  The 10-year ASCVD risk score (Goff DC Jr., et al., 2013) is: 3.4%   Values used to calculate the score:     Age: 59 years     Sex: Female     Is Non-Hispanic African American: No     Diabetic: No     Tobacco smoker: No     Systolic Blood Pressure: 118 mmHg     Is BP treated: Yes     HDL Cholesterol: 56 mg/dL     Total Cholesterol: 255 mg/dL  A/P: Hypertension longstanding currently at goal on current medications. BP Goal = < 130/80 mmHg. Medication adherence reported.  -Continued  lisinopril 10 mg daily.  -F/u labs ordered - CMP14+eGFR -Counseled on lifestyle modifications for blood pressure control including reduced dietary sodium, increased exercise, adequate sleep.  Results reviewed and written information provided.   Total time in face-to-face counseling 15 minutes.   F/U Clinic Visit in 1 month.    Luke Van Ausdall, PharmD, BCACP, CPP Clinical Pharmacist [facility] [phone]      

## 2020-12-20 LAB — CMP14+EGFR
ALT: 14 IU/L (ref 0–32)
AST: 15 IU/L (ref 0–40)
Albumin/Globulin Ratio: 1.9 (ref 1.2–2.2)
Albumin: 4.3 g/dL (ref 3.8–4.9)
Alkaline Phosphatase: 99 IU/L (ref 44–121)
BUN/Creatinine Ratio: 24 — ABNORMAL HIGH (ref 9–23)
BUN: 12 mg/dL (ref 6–24)
Bilirubin Total: <0.2 mg/dL (ref 0.0–1.2)
CO2: 24 mmol/L (ref 20–29)
Calcium: 9.3 mg/dL (ref 8.7–10.2)
Chloride: 105 mmol/L (ref 96–106)
Creatinine, Ser: 0.5 mg/dL — ABNORMAL LOW (ref 0.57–1.00)
Globulin, Total: 2.3 g/dL (ref 1.5–4.5)
Glucose: 90 mg/dL (ref 65–99)
Potassium: 4.4 mmol/L (ref 3.5–5.2)
Sodium: 144 mmol/L (ref 134–144)
Total Protein: 6.6 g/dL (ref 6.0–8.5)
eGFR: 109 mL/min/{1.73_m2} (ref >59)

## 2021-01-15 ENCOUNTER — Other Ambulatory Visit: Payer: Self-pay | Admitting: Obstetrics and Gynecology

## 2021-01-15 DIAGNOSIS — Z1231 Encounter for screening mammogram for malignant neoplasm of breast: Secondary | ICD-10-CM

## 2021-01-18 ENCOUNTER — Other Ambulatory Visit: Payer: Self-pay

## 2021-01-18 ENCOUNTER — Ambulatory Visit
Admission: RE | Admit: 2021-01-18 | Discharge: 2021-01-18 | Disposition: A | Discharge status: Home / Self Care | Payer: No Typology Code available for payment source | Source: Ambulatory Visit | Attending: Obstetrics and Gynecology | Admitting: Obstetrics and Gynecology

## 2021-01-18 DIAGNOSIS — Z1231 Encounter for screening mammogram for malignant neoplasm of breast: Secondary | ICD-10-CM

## 2021-01-31 ENCOUNTER — Other Ambulatory Visit: Payer: Self-pay

## 2021-02-01 ENCOUNTER — Other Ambulatory Visit: Payer: Self-pay

## 2021-02-05 ENCOUNTER — Telehealth: Payer: Self-pay | Admitting: Nurse Practitioner

## 2021-02-05 NOTE — Telephone Encounter (Signed)
Copied from CRM [id number]. Topic: Referral - Request for Referral >> Feb 05, 2021  8:41 AM Leafy Ro wrote: has patient seen PCP for this complaint? no *If NO, is insurance requiring patient see PCP for this issue before PCP can refer them? Pt has orange card and was told she needs a referral to see dental Referral for which specialty: dentist Preferred provider/office: Pt does not know the name of dental place just phone number [phone] Reason for referral: tooth broke off last night and cleaning

## 2021-02-05 NOTE — Telephone Encounter (Signed)
Called pt and made an appt for a tele visit for a referral

## 2021-02-09 ENCOUNTER — Encounter: Payer: Self-pay | Admitting: Nurse Practitioner

## 2021-02-09 ENCOUNTER — Ambulatory Visit: Payer: Self-pay | Attending: Nurse Practitioner | Admitting: Nurse Practitioner

## 2021-02-09 ENCOUNTER — Other Ambulatory Visit: Payer: Self-pay

## 2021-02-09 DIAGNOSIS — K0889 Other specified disorders of teeth and supporting structures: Secondary | ICD-10-CM

## 2021-02-09 NOTE — Progress Notes (Signed)
Virtual Visit via Telephone Note Due to national recommendations of social distancing due to COVID 19, telehealth visit is felt to be most appropriate for this patient at this time.  I discussed the limitations, risks, security and privacy concerns of performing an evaluation and management service by telephone and the availability of in person appointments. I also discussed with the patient that there may be a patient responsible charge related to this service. The patient expressed understanding and agreed to proceed.    I connected with Stacy Hatfield on 02/09/21  at   8:10 AM EDT  EDT by telephone and verified that I am speaking with the correct person using two identifiers.  Location of Patient: Private Residence   Location of Provider: [facility]    Persons participating in Telemedicine visit: Stacy Denver FNP-BC Stacy Hatfield    History of Present Illness: Telemedicine visit for: Dental referral  Endorses fractured right upper molar with filling also missing. Also would like dental cleaning. She does not endorse any symptoms of abscess.     Past Medical History:  Diagnosis Date   Hypertension     No past surgical history on file.  Family History  Problem Relation Age of Onset   Hypertension Mother    Stroke Father    Heart attack Father    Breast cancer Neg Hx     Social History   Socioeconomic History   Marital status: Single    Spouse name: Not on file   Number of children: Not on file   Years of education: Not on file   Highest education level: Not on file  Occupational History   Not on file  Tobacco Use   Smoking status: Never   Smokeless tobacco: Never  Substance and Sexual Activity   Alcohol use: No   Drug use: No   Sexual activity: Not on file  Other Topics Concern   Not on file  Social History Narrative   Not on file   Social Determinants of Health   Financial Resource Strain: Not on file  Food  Insecurity: Not on file  Transportation Needs: Not on file  Physical Activity: Not on file  Stress: Not on file  Social Connections: Not on file     Observations/Objective: Awake, alert and oriented x 3   Review of Systems  Constitutional:  Negative for fever, malaise/fatigue and weight loss.  HENT:  Negative for nosebleeds.        SEE HPI  Eyes: Negative.  Negative for blurred vision, double vision and photophobia.  Respiratory: Negative.  Negative for cough and shortness of breath.   Cardiovascular: Negative.  Negative for chest pain, palpitations and leg swelling.  Gastrointestinal: Negative.  Negative for heartburn, nausea and vomiting.  Musculoskeletal: Negative.  Negative for myalgias.  Neurological: Negative.  Negative for dizziness, focal weakness, seizures and headaches.  Psychiatric/Behavioral: Negative.  Negative for suicidal ideas.    Assessment and Plan: Laiana was seen today for dental problem.  Diagnoses and all orders for this visit:  Pain of molar -     Ambulatory referral to Dentistry    Follow Up Instructions Return in about 4 months (around 06/11/2021) for prediabetes.     I discussed the assessment and treatment plan with the patient. The patient was provided an opportunity to ask questions and all were answered. The patient agreed with the plan and demonstrated an understanding of the instructions.   The patient was advised to call back  or seek an in-person evaluation if the symptoms worsen or if the condition fails to improve as anticipated.  I provided 5 minutes of non-face-to-face time during this encounter including median intraservice time, reviewing previous notes, labs, imaging, medications and explaining diagnosis and management.  Claiborne Rigg, FNP-BC

## 2021-02-15 ENCOUNTER — Ambulatory Visit: Payer: Self-pay | Attending: Nurse Practitioner

## 2021-02-15 DIAGNOSIS — Z23 Encounter for immunization: Secondary | ICD-10-CM

## 2021-02-27 ENCOUNTER — Telehealth: Payer: No Typology Code available for payment source | Admitting: Nurse Practitioner

## 2021-03-01 ENCOUNTER — Other Ambulatory Visit: Payer: Self-pay

## 2021-03-05 ENCOUNTER — Ambulatory Visit: Payer: Medicaid Other | Admitting: Nurse Practitioner

## 2021-03-29 ENCOUNTER — Other Ambulatory Visit: Payer: Self-pay

## 2021-05-10 ENCOUNTER — Other Ambulatory Visit: Payer: Self-pay

## 2021-06-07 ENCOUNTER — Other Ambulatory Visit: Payer: Self-pay

## 2021-07-04 ENCOUNTER — Other Ambulatory Visit: Payer: Self-pay

## 2021-07-05 ENCOUNTER — Other Ambulatory Visit: Payer: Self-pay

## 2021-08-01 ENCOUNTER — Other Ambulatory Visit: Payer: Self-pay

## 2021-08-02 ENCOUNTER — Other Ambulatory Visit: Payer: Self-pay

## 2021-08-15 ENCOUNTER — Other Ambulatory Visit: Payer: Self-pay

## 2021-08-15 ENCOUNTER — Ambulatory Visit: Payer: No Typology Code available for payment source | Attending: Nurse Practitioner | Admitting: Nurse Practitioner

## 2021-08-15 ENCOUNTER — Encounter: Payer: Self-pay | Admitting: Nurse Practitioner

## 2021-08-15 VITALS — BP 128/83 | HR 66 | Wt 174.0 lb

## 2021-08-15 DIAGNOSIS — Z79899 Other long term (current) drug therapy: Secondary | ICD-10-CM | POA: Insufficient documentation

## 2021-08-15 DIAGNOSIS — Z23 Encounter for immunization: Secondary | ICD-10-CM | POA: Insufficient documentation

## 2021-08-15 DIAGNOSIS — Z8249 Family history of ischemic heart disease and other diseases of the circulatory system: Secondary | ICD-10-CM | POA: Insufficient documentation

## 2021-08-15 DIAGNOSIS — Z760 Encounter for issue of repeat prescription: Secondary | ICD-10-CM | POA: Insufficient documentation

## 2021-08-15 DIAGNOSIS — I10 Essential (primary) hypertension: Secondary | ICD-10-CM | POA: Insufficient documentation

## 2021-08-15 MED ORDER — LISINOPRIL 10 MG PO TABS
10.0000 mg | ORAL_TABLET | Freq: Every day | ORAL | 3 refills | Status: DC
  Filled 2021-08-15 – 2021-08-30 (×2): qty 90, 90d supply, fill #0

## 2021-08-15 NOTE — Progress Notes (Signed)
? ?Assessment & Plan:  ?Stacy Hatfield was seen today for medication refill and hypertension. ? ?Diagnoses and all orders for this visit: ? ?Primary hypertension ?-     lisinopril (ZESTRIL) 10 MG tablet; Take 1 tablet (10 mg total) by mouth daily. ? ?Need for shingles vaccine ?Administered today  ? ? ?Patient has been counseled on age-appropriate routine health concerns for screening and prevention. These are reviewed and up-to-date. Referrals have been placed accordingly. Immunizations are up-to-date or declined.    ?Subjective:  ? ?Chief Complaint  ?Patient presents with  ? Medication Refill  ? Hypertension  ? ?HPI ?Stacy Hatfield 61 y.o. female presents to office today for follow up to HTN ?She has a past medical history of Hypertension.  ? ?States a few weeks ago she had symptoms of fatigue, decreased appetite, weight loss and overall not feeling well. Symptoms lasted a few days and have completely resolved today.  ? ?Blood pressure is well controlled with lisinopril 10 mg daily.Marland Kitchen  ?BP Readings from Last 3 Encounters:  ?08/15/21 128/83  ?12/19/20 118/71  ?12/01/20 (!) 143/81  ?  ? ?Review of Systems  ?Constitutional:  Negative for fever, malaise/fatigue and weight loss.  ?HENT: Negative.  Negative for nosebleeds.   ?Eyes: Negative.  Negative for blurred vision, double vision and photophobia.  ?Respiratory: Negative.  Negative for cough and shortness of breath.   ?Cardiovascular: Negative.  Negative for chest pain, palpitations and leg swelling.  ?Gastrointestinal: Negative.  Negative for heartburn, nausea and vomiting.  ?Musculoskeletal: Negative.  Negative for myalgias.  ?Neurological: Negative.  Negative for dizziness, focal weakness, seizures and headaches.  ?Psychiatric/Behavioral: Negative.  Negative for suicidal ideas.   ? ?Past Medical History:  ?Diagnosis Date  ? Hypertension   ? ? ?History reviewed. No pertinent surgical history. ? ?Family History  ?Problem Relation Age of Onset  ? Hypertension Mother   ?  Stroke Father   ? Heart attack Father   ? Breast cancer Neg Hx   ? ? ?Social History Reviewed with no changes to be made today.  ? ?Outpatient Medications Prior to Visit  ?Medication Sig Dispense Refill  ? lisinopril (ZESTRIL) 10 MG tablet Take 1 tablet (10 mg total) by mouth daily. 90 tablet 3  ? ?No facility-administered medications prior to visit.  ? ? ?No Known Allergies ? ?   ?Objective:  ?  ?BP 128/83 (BP Location: Left Arm, Cuff Size: Large)   Pulse 66   Wt 174 lb (78.9 kg)   LMP 10/18/2012   SpO2 96%   BMI 32.88 kg/m?  ?Wt Readings from Last 3 Encounters:  ?08/15/21 174 lb (78.9 kg)  ?12/01/20 182 lb (82.6 kg)  ?04/08/19 165 lb (74.8 kg)  ? ? ?Physical Exam ?Vitals and nursing note reviewed.  ?Constitutional:   ?   Appearance: She is well-developed.  ?HENT:  ?   Head: Normocephalic and atraumatic.  ?Cardiovascular:  ?   Rate and Rhythm: Normal rate and regular rhythm.  ?   Heart sounds: Normal heart sounds. No murmur heard. ?  No friction rub. No gallop.  ?Pulmonary:  ?   Effort: Pulmonary effort is normal. No tachypnea or respiratory distress.  ?   Breath sounds: Normal breath sounds. No decreased breath sounds, wheezing, rhonchi or rales.  ?Chest:  ?   Chest wall: No tenderness.  ?Abdominal:  ?   General: Bowel sounds are normal.  ?   Palpations: Abdomen is soft.  ?Musculoskeletal:     ?   General:  Normal range of motion.  ?   Cervical back: Normal range of motion.  ?Skin: ?   General: Skin is warm and dry.  ?Neurological:  ?   Mental Status: She is alert and oriented to person, place, and time.  ?   Coordination: Coordination normal.  ?Psychiatric:     ?   Behavior: Behavior normal. Behavior is cooperative.     ?   Thought Content: Thought content normal.     ?   Judgment: Judgment normal.  ? ? ? ? ?   ?Patient has been counseled extensively about nutrition and exercise as well as the importance of adherence with medications and regular follow-up. The patient was given clear instructions to go to ER  or return to medical center if symptoms don't improve, worsen or new problems develop. The patient verbalized understanding.  ? ?Follow-up: Return for physical/shingles vaccine/A1c/ labs in June .  ? ?Claiborne Rigg, FNP-BC ?[facility] ?[place], [place] ?[phone]   ?08/15/2021, 10:36 PM ?

## 2021-08-30 ENCOUNTER — Other Ambulatory Visit: Payer: Self-pay

## 2021-09-10 ENCOUNTER — Ambulatory Visit: Payer: No Typology Code available for payment source | Admitting: Nurse Practitioner

## 2021-10-19 ENCOUNTER — Other Ambulatory Visit: Payer: Self-pay

## 2021-10-19 ENCOUNTER — Ambulatory Visit: Payer: Medicaid Other | Attending: Nurse Practitioner | Admitting: Nurse Practitioner

## 2021-10-19 ENCOUNTER — Encounter: Payer: Self-pay | Admitting: Nurse Practitioner

## 2021-10-19 VITALS — BP 132/85 | HR 52 | Wt 171.8 lb

## 2021-10-19 DIAGNOSIS — Z0001 Encounter for general adult medical examination with abnormal findings: Secondary | ICD-10-CM

## 2021-10-19 DIAGNOSIS — Z23 Encounter for immunization: Secondary | ICD-10-CM

## 2021-10-19 DIAGNOSIS — Z1211 Encounter for screening for malignant neoplasm of colon: Secondary | ICD-10-CM

## 2021-10-19 DIAGNOSIS — Z0000 Encounter for general adult medical examination without abnormal findings: Secondary | ICD-10-CM

## 2021-10-19 DIAGNOSIS — N6322 Unspecified lump in the left breast, upper inner quadrant: Secondary | ICD-10-CM

## 2021-10-19 DIAGNOSIS — I10 Essential (primary) hypertension: Secondary | ICD-10-CM

## 2021-10-19 DIAGNOSIS — R7303 Prediabetes: Secondary | ICD-10-CM

## 2021-10-19 MED ORDER — LISINOPRIL 20 MG PO TABS
20.0000 mg | ORAL_TABLET | Freq: Every day | ORAL | 3 refills | Status: AC
  Filled 2021-10-19 – 2021-11-07 (×2): qty 90, 90d supply, fill #0
  Filled 2022-01-31: qty 90, 90d supply, fill #1

## 2021-10-19 NOTE — Progress Notes (Signed)
Assessment & Plan:  Stacy Hatfield was seen today for annual exam.  Diagnoses and all orders for this visit:  Encounter for annual physical exam -     CMP14+EGFR REPEAT BMP in 2-3 weeks -     Hemoglobin A1c -     Lipid panel -     CBC -     Fecal occult blood, imunochemical([facility])  Prediabetes -     CMP14+EGFR -     Hemoglobin A1c  Primary hypertension -     CMP14+EGFR -     lisinopril (ZESTRIL) 20 MG tablet; Take 1 tablet (20 mg total) by mouth daily. Please fill as a 90 day supply  Colon cancer screening -     Fecal occult blood, imunochemical([facility]/Sunquest)  Mass of upper inner quadrant of left breast -     MS DIGITAL SCREENING TOMO BILATERAL; Future -     US BREAST COMPLETE UNI LEFT INC AXILLA; Future  Need for shingles vaccine -     Varicella-zoster vaccine IM (Shingrix)    Patient has been counseled on age-appropriate routine health concerns for screening and prevention. These are reviewed and up-to-date. Referrals have been placed accordingly. Immunizations are up-to-date or declined.    Subjective:   Chief Complaint  Patient presents with   Annual Exam   [name] 61 y.o. female presents to office today for annual exam. She has a past medical history of Hypertension.   Blood pressure is slightly elevated today. Increasing lisinopril today from 43m to 23m  BP Readings from Last 3 Encounters:  10/19/21 132/85  08/15/21 128/83  12/19/20 118/71    She has what appears to be a cyst on the left intermediate phalanx.  Wants to hold off on referral at this time. No signs of infection/cellulitis on exam.   Prediabetes Well controlled with diet and weight management at this time.  Lab Results  Component Value Date   HGBA1C 5.8 (H) 10/19/2021     Review of Systems  Constitutional:  Negative for fever, malaise/fatigue and weight loss.  HENT: Negative.  Negative for nosebleeds.   Eyes: Negative.  Negative for blurred vision, double vision  and photophobia.  Respiratory: Negative.  Negative for cough and shortness of breath.   Cardiovascular: Negative.  Negative for chest pain, palpitations and leg swelling.  Gastrointestinal: Negative.  Negative for heartburn, nausea and vomiting.  Genitourinary: Negative.   Musculoskeletal: Negative.  Negative for myalgias.  Skin:        SEE HPI  Neurological: Negative.  Negative for dizziness, focal weakness, seizures and headaches.  Endo/Heme/Allergies: Negative.   Psychiatric/Behavioral: Negative.  Negative for suicidal ideas.     Past Medical History:  Diagnosis Date   Hypertension     History reviewed. No pertinent surgical history.  Family History  Problem Relation Age of Onset   Hypertension Mother    Stroke Father    Heart attack Father    Breast cancer Neg Hx     Social History Reviewed with no changes to be made today.   Outpatient Medications Prior to Visit  Medication Sig Dispense Refill   lisinopril (ZESTRIL) 10 MG tablet Take 1 tablet (10 mg total) by mouth daily. 90 tablet 3   No facility-administered medications prior to visit.    No Known Allergies     Objective:    BP 132/85   Pulse (!) 52   Wt 171 lb 12.8 oz (77.9 kg)   LMP 10/18/2012   SpO2 97%  BMI 32.46 kg/m  Wt Readings from Last 3 Encounters:  10/19/21 171 lb 12.8 oz (77.9 kg)  08/15/21 174 lb (78.9 kg)  12/01/20 182 lb (82.6 kg)    Physical Exam Constitutional:      Appearance: She is well-developed.  HENT:     Head: Normocephalic and atraumatic.     Right Ear: Hearing, tympanic membrane, ear canal and external ear normal.     Left Ear: Hearing, tympanic membrane, ear canal and external ear normal.     Nose: Nose normal.     Right Turbinates: Not enlarged.     Left Turbinates: Not enlarged.     Mouth/Throat:     Lips: Pink.     Mouth: Mucous membranes are moist.     Dentition: No dental tenderness, gingival swelling, dental abscesses or gum lesions.     Pharynx: No  oropharyngeal exudate.  Eyes:     General: No scleral icterus.       Right eye: No discharge.     Extraocular Movements: Extraocular movements intact.     Conjunctiva/sclera: Conjunctivae normal.     Pupils: Pupils are equal, round, and reactive to light.  Neck:     Thyroid: No thyromegaly.     Trachea: No tracheal deviation.  Cardiovascular:     Rate and Rhythm: Normal rate and regular rhythm.     Heart sounds: Normal heart sounds. No murmur heard.    No friction rub.  Pulmonary:     Effort: Pulmonary effort is normal. No accessory muscle usage or respiratory distress.     Breath sounds: Normal breath sounds. No decreased breath sounds, wheezing, rhonchi or rales.  Chest:    Abdominal:     General: Bowel sounds are normal. There is no distension.     Palpations: Abdomen is soft. There is no mass.     Tenderness: There is no abdominal tenderness. There is no right CVA tenderness, left CVA tenderness, guarding or rebound.     Hernia: No hernia is present.  Musculoskeletal:        General: No tenderness or deformity. Normal range of motion.     Cervical back: Normal range of motion and neck supple.  Lymphadenopathy:     Cervical: No cervical adenopathy.  Skin:    General: Skin is warm and dry.     Findings: No erythema.  Neurological:     Mental Status: She is alert and oriented to person, place, and time.     Cranial Nerves: No cranial nerve deficit.     Motor: Motor function is intact.     Coordination: Coordination is intact. Coordination normal.     Gait: Gait is intact.     Deep Tendon Reflexes:     Reflex Scores:      Patellar reflexes are 1+ on the right side and 1+ on the left side. Psychiatric:        Attention and Perception: Attention normal.        Mood and Affect: Mood normal.        Speech: Speech normal.        Behavior: Behavior normal.        Thought Content: Thought content normal.        Judgment: Judgment normal.          Patient has been  counseled extensively about nutrition and exercise as well as the importance of adherence with medications and regular follow-up. The patient was given clear instructions to go to  ER or return to medical center if symptoms don't improve, worsen or new problems develop. The patient verbalized understanding.   Follow-up: Return in about 3 months (around 01/19/2022) for HTN.   Gildardo Pounds, FNP-BC [facility] [place], [place]   10/25/2021, 1:10 PM

## 2021-10-19 NOTE — Patient Instructions (Signed)
[facility] Dermatology clinic ph# [phone] Address [address]

## 2021-10-20 LAB — LIPID PANEL
Chol/HDL Ratio: 4.3 ratio (ref 0.0–4.4)
Cholesterol, Total: 239 mg/dL — ABNORMAL HIGH (ref 100–199)
HDL: 55 mg/dL (ref >39)
LDL Chol Calc (NIH): 163 mg/dL — ABNORMAL HIGH (ref 0–99)
Triglycerides: 119 mg/dL (ref 0–149)
VLDL Cholesterol Cal: 21 mg/dL (ref 5–40)

## 2021-10-20 LAB — CBC
Hematocrit: 40.1 % (ref 34.0–46.6)
Hemoglobin: 13.5 g/dL (ref 11.1–15.9)
MCH: 30.1 pg (ref 26.6–33.0)
MCHC: 33.7 g/dL (ref 31.5–35.7)
MCV: 90 fL (ref 79–97)
Platelets: 231 10*3/uL (ref 150–450)
RBC: 4.48 x10E6/uL (ref 3.77–5.28)
RDW: 12.3 % (ref 11.7–15.4)
WBC: 5.8 10*3/uL (ref 3.4–10.8)

## 2021-10-20 LAB — HEMOGLOBIN A1C
Est. average glucose Bld gHb Est-mCnc: 120 mg/dL
Hgb A1c MFr Bld: 5.8 % — ABNORMAL HIGH (ref 4.8–5.6)

## 2021-10-20 LAB — CMP14+EGFR
ALT: 14 IU/L (ref 0–32)
AST: 15 IU/L (ref 0–40)
Albumin/Globulin Ratio: 2 (ref 1.2–2.2)
Albumin: 4.3 g/dL (ref 3.8–4.9)
Alkaline Phosphatase: 100 IU/L (ref 44–121)
BUN/Creatinine Ratio: 19 (ref 9–23)
BUN: 11 mg/dL (ref 6–24)
Bilirubin Total: 0.3 mg/dL (ref 0.0–1.2)
CO2: 24 mmol/L (ref 20–29)
Calcium: 9.2 mg/dL (ref 8.7–10.2)
Chloride: 104 mmol/L (ref 96–106)
Creatinine, Ser: 0.59 mg/dL (ref 0.57–1.00)
Globulin, Total: 2.2 g/dL (ref 1.5–4.5)
Glucose: 110 mg/dL — ABNORMAL HIGH (ref 70–99)
Potassium: 4.2 mmol/L (ref 3.5–5.2)
Sodium: 142 mmol/L (ref 134–144)
Total Protein: 6.5 g/dL (ref 6.0–8.5)
eGFR: 104 mL/min/{1.73_m2} (ref >59)

## 2021-10-25 ENCOUNTER — Encounter: Payer: Self-pay | Admitting: Nurse Practitioner

## 2021-10-26 ENCOUNTER — Other Ambulatory Visit: Payer: Self-pay

## 2021-10-26 DIAGNOSIS — N6322 Unspecified lump in the left breast, upper inner quadrant: Secondary | ICD-10-CM

## 2021-11-06 ENCOUNTER — Other Ambulatory Visit: Payer: Self-pay

## 2021-11-06 ENCOUNTER — Ambulatory Visit: Payer: Self-pay | Admitting: *Deleted

## 2021-11-06 VITALS — BP 130/78 | Wt 173.1 lb

## 2021-11-06 DIAGNOSIS — N6322 Unspecified lump in the left breast, upper inner quadrant: Secondary | ICD-10-CM

## 2021-11-06 DIAGNOSIS — N6320 Unspecified lump in the left breast, unspecified quadrant: Secondary | ICD-10-CM

## 2021-11-06 DIAGNOSIS — Z1239 Encounter for other screening for malignant neoplasm of breast: Secondary | ICD-10-CM

## 2021-11-06 NOTE — Patient Instructions (Signed)
Explained breast self awareness with Stacy Hatfield. Patient did not need a Pap smear today due to last Pap smear and HPV typing was 12/01/2020. Let her know BCCCP will cover Pap smears and HPV typing every 5 years unless has a history of abnormal Pap smears. Referred patient to the [facility] for a left diagnostic mammogram. Appointment scheduled Thursday, November 08, 2021 at 1520. Patient aware of appointment and will be there. Auriana A Naim verbalized understanding.  Dallyn Bergland, Kathaleen Maser, RN 3:02 PM

## 2021-11-06 NOTE — Progress Notes (Signed)
[name] is a 61 y.o. female who presents to [facility] clinic today with complaint of left breast lump x one month.    Pap Smear: Pap smear not completed today. Last Pap smear was 12/01/2020 at [facility] and was normal with negative HPV. Per patient has a history of an abnormal Pap smear 17 years that required a colposcopy for follow-up. Patient states that she has had at least 3 normal Pap smears since colposcopy Last Pap smear result is available in Epic.   Physical exam: Breasts Left breast slightly larger than right breast. No skin abnormalities bilateral breasts. No nipple retraction bilateral breasts. No nipple discharge bilateral breasts. No lymphadenopathy. No lumps palpated right breast. Palpated a bb sized lump verus thickened breast tissue at 11 o'clock 10 cm from the nipple. No complaints of pain or tenderness on exam.  MM 3D SCREEN BREAST BILATERAL  Result Date: 01/23/2021 CLINICAL DATA:  Screening. EXAM: DIGITAL SCREENING BILATERAL MAMMOGRAM WITH TOMOSYNTHESIS AND CAD TECHNIQUE: Bilateral screening digital craniocaudal and mediolateral oblique mammograms were obtained. Bilateral screening digital breast tomosynthesis was performed. The images were evaluated with computer-aided detection. COMPARISON:  Previous exam(s). ACR Breast Density Category a: The breast tissue is almost entirely fatty. FINDINGS: There are no findings suspicious for malignancy. IMPRESSION: No mammographic evidence of malignancy. A result letter of this screening mammogram will be mailed directly to the patient. RECOMMENDATION: Screening mammogram in one year. (Code:[id number]) BI-RADS CATEGORY  1: Negative. Electronically Signed   By: Sande Brothers M.D.   On: 01/23/2021 08:39    Pelvic/Bimanual Pap is not indicated today per BCCCP guidelines.   Smoking History: Patient is a former smoker that quit in 2007.   Patient Navigation: Patient education provided. Access to  services provided for patient through BCCCP program.   Colorectal Cancer Screening: Per patient has never had colonoscopy completed. Patient completed a FIT Test 12/05/2020 that was negative. Patient stated her PCP give her a FIT Test a couple of weeks ago that she has at home and will complete it. No complaints today.    Breast and Cervical Cancer Risk Assessment: Patient does not have family history of breast cancer, known genetic mutations, or radiation treatment to the chest before age 4. Per patient has history of cervical dysplasia. Patient has no history of being immunocompromised or DES exposure in-utero.  Risk Assessment     Risk Scores       11/06/2021   Last edited by: Meryl Dare, CMA   5-year risk: 1.1 %   Lifetime risk: 6.3 %            A: BCCCP exam without pap smear Complaint of left breast lump.  P: Referred patient to the [facility] for a left diagnostic mammogram. Appointment scheduled Thursday, November 08, 2021 at 1520.  Stacy Heidelberg, RN 11/06/2021 3:02 PM

## 2021-11-07 ENCOUNTER — Other Ambulatory Visit: Payer: Self-pay

## 2021-11-08 ENCOUNTER — Ambulatory Visit
Admission: RE | Admit: 2021-11-08 | Discharge: 2021-11-08 | Disposition: A | Discharge status: Home / Self Care | Payer: No Typology Code available for payment source | Source: Ambulatory Visit | Attending: Obstetrics and Gynecology | Admitting: Obstetrics and Gynecology

## 2021-11-08 ENCOUNTER — Other Ambulatory Visit: Payer: Self-pay | Admitting: Obstetrics and Gynecology

## 2021-11-08 ENCOUNTER — Other Ambulatory Visit: Payer: Self-pay

## 2021-11-08 DIAGNOSIS — N6320 Unspecified lump in the left breast, unspecified quadrant: Secondary | ICD-10-CM

## 2021-11-08 DIAGNOSIS — N6322 Unspecified lump in the left breast, upper inner quadrant: Secondary | ICD-10-CM

## 2021-11-16 ENCOUNTER — Telehealth: Payer: Self-pay | Admitting: Emergency Medicine

## 2021-11-16 NOTE — Telephone Encounter (Signed)
Copied from CRM [id number]. Topic: Referral - Request for Referral >> Nov 16, 2021  2:34 PM Tiffany B wrote: Patient states PCP advised once she was approved for the orange card to call in and PCP would place a referral to a dermatologist. Patient would like a follow up call

## 2021-11-16 NOTE — Telephone Encounter (Signed)
Pt has OC and requesting referral.

## 2021-11-18 ENCOUNTER — Other Ambulatory Visit: Payer: Self-pay | Admitting: Nurse Practitioner

## 2021-11-18 DIAGNOSIS — L988 Other specified disorders of the skin and subcutaneous tissue: Secondary | ICD-10-CM

## 2021-11-18 NOTE — Telephone Encounter (Signed)
Referral placed.

## 2021-11-29 ENCOUNTER — Telehealth: Payer: Self-pay | Admitting: Nurse Practitioner

## 2021-11-29 NOTE — Telephone Encounter (Signed)
Pt is calling check on the status of referral for Dermatology. OC has been updated. Pt is looking for the dermatologist not in [place] approved under orange card. Please advise Cb- [phone]

## 2021-11-29 NOTE — Telephone Encounter (Signed)
Pt states that her OC has been renewed.

## 2021-12-07 NOTE — Telephone Encounter (Signed)
Noted  

## 2022-01-01 ENCOUNTER — Telehealth: Payer: Self-pay | Admitting: Emergency Medicine

## 2022-01-01 NOTE — Telephone Encounter (Signed)
Copied from CRM [id number]. Topic: Referral - Status >> Jan 01, 2022  4:39 PM Macon Large wrote: Reason for CRM: Pt reports that she received her approval for financial assistance and she would like for the referral to dermatology to be placed.

## 2022-01-02 ENCOUNTER — Other Ambulatory Visit: Payer: Self-pay | Admitting: Nurse Practitioner

## 2022-01-02 DIAGNOSIS — L988 Other specified disorders of the skin and subcutaneous tissue: Secondary | ICD-10-CM

## 2022-01-02 NOTE — Telephone Encounter (Signed)
Please resend referral, previous referral is closed.

## 2022-01-31 ENCOUNTER — Other Ambulatory Visit: Payer: Self-pay

## 2022-02-04 ENCOUNTER — Other Ambulatory Visit (HOSPITAL_COMMUNITY): Payer: Self-pay

## 2022-02-04 MED ORDER — HYDROCODONE-ACETAMINOPHEN 10-325 MG PO TABS
1.0000 | ORAL_TABLET | Freq: Three times a day (TID) | ORAL | 0 refills | Status: DC
  Filled 2022-02-04: qty 90, 30d supply, fill #0

## 2022-02-07 ENCOUNTER — Telehealth: Payer: Self-pay | Admitting: Emergency Medicine

## 2022-02-07 NOTE — Telephone Encounter (Signed)
May I send again ?

## 2022-02-07 NOTE — Telephone Encounter (Signed)
Copied from [place] [id number]. Topic: General - Inquiry >> Feb 07, 2022  2:25 PM Erskine Squibb wrote: Reason for CRM: Patient has been approved for financial assistance and would like to proceed with the new referral to a dermatologist that is not [facility]. Please assist patient further. >> Feb 07, 2022  2:29 PM Erskine Squibb wrote: Patient has been waiting for this referral for a few moths and would like an appointment as soon as possible

## 2022-02-12 ENCOUNTER — Encounter: Payer: Self-pay | Admitting: Nurse Practitioner

## 2022-02-14 ENCOUNTER — Other Ambulatory Visit: Payer: No Typology Code available for payment source

## 2022-02-14 NOTE — Telephone Encounter (Signed)
Vm left with response

## 2022-02-15 NOTE — Telephone Encounter (Signed)
Pt called back wanting to know if there are any other dermatologist with [facility].  She got approved 100% for financial assistance with [facility].  CB@  [phone]

## 2022-02-19 ENCOUNTER — Ambulatory Visit
Admission: RE | Admit: 2022-02-19 | Discharge: 2022-02-19 | Disposition: A | Discharge status: Home / Self Care | Payer: No Typology Code available for payment source | Source: Ambulatory Visit | Attending: Obstetrics and Gynecology | Admitting: Obstetrics and Gynecology

## 2022-02-19 DIAGNOSIS — N6320 Unspecified lump in the left breast, unspecified quadrant: Secondary | ICD-10-CM

## 2022-03-25 ENCOUNTER — Other Ambulatory Visit (HOSPITAL_COMMUNITY): Payer: Self-pay

## 2022-03-25 MED ORDER — HYDROCODONE-ACETAMINOPHEN 10-325 MG PO TABS
1.0000 | ORAL_TABLET | Freq: Three times a day (TID) | ORAL | 0 refills | Status: DC | PRN
  Filled 2022-03-25: qty 90, 30d supply, fill #0

## 2022-03-26 ENCOUNTER — Other Ambulatory Visit (HOSPITAL_COMMUNITY): Payer: Self-pay

## 2022-04-21 DIAGNOSIS — Z79899 Other long term (current) drug therapy: Secondary | ICD-10-CM | POA: Diagnosis not present

## 2022-04-21 DIAGNOSIS — M549 Dorsalgia, unspecified: Secondary | ICD-10-CM | POA: Diagnosis not present

## 2022-04-21 DIAGNOSIS — Z23 Encounter for immunization: Secondary | ICD-10-CM | POA: Diagnosis not present

## 2022-04-21 DIAGNOSIS — E669 Obesity, unspecified: Secondary | ICD-10-CM | POA: Diagnosis not present

## 2022-04-21 DIAGNOSIS — I10 Essential (primary) hypertension: Secondary | ICD-10-CM | POA: Diagnosis not present

## 2022-04-30 ENCOUNTER — Telehealth: Payer: Self-pay | Admitting: Nurse Practitioner

## 2022-04-30 NOTE — Telephone Encounter (Signed)
Referral Request - Has patient seen PCP for this complaint? Yes  Referral for which specialty: Dermatology  Preferred provider/office: Dr. [facility]  [phone]                                         [address], [facility]    Reason for referral: spot on finger, mole on breast

## 2022-05-08 ENCOUNTER — Other Ambulatory Visit (INDEPENDENT_AMBULATORY_CARE_PROVIDER_SITE_OTHER): Payer: Self-pay | Admitting: Primary Care

## 2022-05-08 DIAGNOSIS — L988 Other specified disorders of the skin and subcutaneous tissue: Secondary | ICD-10-CM

## 2022-05-21 ENCOUNTER — Other Ambulatory Visit: Payer: Self-pay

## 2022-05-21 DIAGNOSIS — I10 Essential (primary) hypertension: Secondary | ICD-10-CM | POA: Diagnosis not present

## 2022-05-21 DIAGNOSIS — M549 Dorsalgia, unspecified: Secondary | ICD-10-CM | POA: Diagnosis not present

## 2022-05-21 DIAGNOSIS — Z79899 Other long term (current) drug therapy: Secondary | ICD-10-CM | POA: Diagnosis not present

## 2022-05-21 DIAGNOSIS — R030 Elevated blood-pressure reading, without diagnosis of hypertension: Secondary | ICD-10-CM | POA: Diagnosis not present

## 2022-05-21 DIAGNOSIS — Z6831 Body mass index (BMI) 31.0-31.9, adult: Secondary | ICD-10-CM | POA: Diagnosis not present

## 2022-05-21 DIAGNOSIS — J40 Bronchitis, not specified as acute or chronic: Secondary | ICD-10-CM | POA: Diagnosis not present

## 2022-05-28 DIAGNOSIS — Z79899 Other long term (current) drug therapy: Secondary | ICD-10-CM | POA: Diagnosis not present

## 2022-06-07 DIAGNOSIS — L821 Other seborrheic keratosis: Secondary | ICD-10-CM | POA: Diagnosis not present

## 2022-06-07 DIAGNOSIS — M67442 Ganglion, left hand: Secondary | ICD-10-CM | POA: Diagnosis not present

## 2022-06-20 DIAGNOSIS — I10 Essential (primary) hypertension: Secondary | ICD-10-CM | POA: Diagnosis not present

## 2022-06-20 DIAGNOSIS — M549 Dorsalgia, unspecified: Secondary | ICD-10-CM | POA: Diagnosis not present

## 2022-06-20 DIAGNOSIS — Z79899 Other long term (current) drug therapy: Secondary | ICD-10-CM | POA: Diagnosis not present

## 2022-06-20 DIAGNOSIS — E669 Obesity, unspecified: Secondary | ICD-10-CM | POA: Diagnosis not present

## 2022-06-24 DIAGNOSIS — Z79899 Other long term (current) drug therapy: Secondary | ICD-10-CM | POA: Diagnosis not present

## 2022-07-16 ENCOUNTER — Other Ambulatory Visit (HOSPITAL_COMMUNITY): Payer: Self-pay

## 2022-07-16 DIAGNOSIS — I10 Essential (primary) hypertension: Secondary | ICD-10-CM | POA: Diagnosis not present

## 2022-07-16 DIAGNOSIS — Z79899 Other long term (current) drug therapy: Secondary | ICD-10-CM | POA: Diagnosis not present

## 2022-07-16 DIAGNOSIS — M549 Dorsalgia, unspecified: Secondary | ICD-10-CM | POA: Diagnosis not present

## 2022-07-16 DIAGNOSIS — E669 Obesity, unspecified: Secondary | ICD-10-CM | POA: Diagnosis not present

## 2022-07-16 MED ORDER — HYDROCODONE-ACETAMINOPHEN 10-325 MG PO TABS
1.0000 | ORAL_TABLET | Freq: Three times a day (TID) | ORAL | 0 refills | Status: AC | PRN
  Filled 2022-07-16 – 2022-07-18 (×2): qty 90, 30d supply, fill #0

## 2022-07-18 ENCOUNTER — Other Ambulatory Visit (HOSPITAL_COMMUNITY): Payer: Self-pay

## 2022-07-18 DIAGNOSIS — Z79899 Other long term (current) drug therapy: Secondary | ICD-10-CM | POA: Diagnosis not present

## 2022-08-13 DIAGNOSIS — Z79899 Other long term (current) drug therapy: Secondary | ICD-10-CM | POA: Diagnosis not present

## 2022-08-13 DIAGNOSIS — M549 Dorsalgia, unspecified: Secondary | ICD-10-CM | POA: Diagnosis not present

## 2022-08-13 DIAGNOSIS — E669 Obesity, unspecified: Secondary | ICD-10-CM | POA: Diagnosis not present

## 2022-08-13 DIAGNOSIS — I10 Essential (primary) hypertension: Secondary | ICD-10-CM | POA: Diagnosis not present

## 2022-08-14 DIAGNOSIS — H3589 Other specified retinal disorders: Secondary | ICD-10-CM | POA: Diagnosis not present

## 2022-08-14 DIAGNOSIS — H5203 Hypermetropia, bilateral: Secondary | ICD-10-CM | POA: Diagnosis not present

## 2022-08-14 DIAGNOSIS — H35033 Hypertensive retinopathy, bilateral: Secondary | ICD-10-CM | POA: Diagnosis not present

## 2022-08-14 DIAGNOSIS — H1045 Other chronic allergic conjunctivitis: Secondary | ICD-10-CM | POA: Diagnosis not present

## 2022-08-14 DIAGNOSIS — H40023 Open angle with borderline findings, high risk, bilateral: Secondary | ICD-10-CM | POA: Diagnosis not present

## 2022-08-14 DIAGNOSIS — H2513 Age-related nuclear cataract, bilateral: Secondary | ICD-10-CM | POA: Diagnosis not present

## 2022-08-15 DIAGNOSIS — H5213 Myopia, bilateral: Secondary | ICD-10-CM | POA: Diagnosis not present

## 2022-08-28 DIAGNOSIS — H40023 Open angle with borderline findings, high risk, bilateral: Secondary | ICD-10-CM | POA: Diagnosis not present

## 2022-08-28 DIAGNOSIS — H3589 Other specified retinal disorders: Secondary | ICD-10-CM | POA: Diagnosis not present

## 2022-08-28 DIAGNOSIS — H47292 Other optic atrophy, left eye: Secondary | ICD-10-CM | POA: Diagnosis not present

## 2022-09-09 DIAGNOSIS — I6782 Cerebral ischemia: Secondary | ICD-10-CM | POA: Diagnosis not present

## 2022-09-09 DIAGNOSIS — H4720 Unspecified optic atrophy: Secondary | ICD-10-CM | POA: Diagnosis not present

## 2022-09-11 DIAGNOSIS — I10 Essential (primary) hypertension: Secondary | ICD-10-CM | POA: Diagnosis not present

## 2022-09-11 DIAGNOSIS — E559 Vitamin D deficiency, unspecified: Secondary | ICD-10-CM | POA: Diagnosis not present

## 2022-09-11 DIAGNOSIS — M549 Dorsalgia, unspecified: Secondary | ICD-10-CM | POA: Diagnosis not present

## 2022-09-11 DIAGNOSIS — Z1159 Encounter for screening for other viral diseases: Secondary | ICD-10-CM | POA: Diagnosis not present

## 2022-09-11 DIAGNOSIS — Z79899 Other long term (current) drug therapy: Secondary | ICD-10-CM | POA: Diagnosis not present

## 2022-09-11 DIAGNOSIS — Z131 Encounter for screening for diabetes mellitus: Secondary | ICD-10-CM | POA: Diagnosis not present

## 2022-09-11 DIAGNOSIS — M129 Arthropathy, unspecified: Secondary | ICD-10-CM | POA: Diagnosis not present

## 2022-09-11 DIAGNOSIS — E669 Obesity, unspecified: Secondary | ICD-10-CM | POA: Diagnosis not present

## 2022-09-11 DIAGNOSIS — Z1211 Encounter for screening for malignant neoplasm of colon: Secondary | ICD-10-CM | POA: Diagnosis not present

## 2022-09-23 DIAGNOSIS — H40023 Open angle with borderline findings, high risk, bilateral: Secondary | ICD-10-CM | POA: Diagnosis not present

## 2022-09-23 DIAGNOSIS — H524 Presbyopia: Secondary | ICD-10-CM | POA: Diagnosis not present

## 2022-09-23 DIAGNOSIS — H47292 Other optic atrophy, left eye: Secondary | ICD-10-CM | POA: Diagnosis not present

## 2022-09-23 DIAGNOSIS — H3589 Other specified retinal disorders: Secondary | ICD-10-CM | POA: Diagnosis not present

## 2022-10-08 DIAGNOSIS — S46912A Strain of unspecified muscle, fascia and tendon at shoulder and upper arm level, left arm, initial encounter: Secondary | ICD-10-CM | POA: Diagnosis not present

## 2022-10-08 DIAGNOSIS — I10 Essential (primary) hypertension: Secondary | ICD-10-CM | POA: Diagnosis not present

## 2022-10-09 DIAGNOSIS — E669 Obesity, unspecified: Secondary | ICD-10-CM | POA: Diagnosis not present

## 2022-10-09 DIAGNOSIS — Z6832 Body mass index (BMI) 32.0-32.9, adult: Secondary | ICD-10-CM | POA: Diagnosis not present

## 2022-10-09 DIAGNOSIS — R030 Elevated blood-pressure reading, without diagnosis of hypertension: Secondary | ICD-10-CM | POA: Diagnosis not present

## 2022-10-09 DIAGNOSIS — Z79899 Other long term (current) drug therapy: Secondary | ICD-10-CM | POA: Diagnosis not present

## 2022-10-09 DIAGNOSIS — M549 Dorsalgia, unspecified: Secondary | ICD-10-CM | POA: Diagnosis not present

## 2022-10-14 DIAGNOSIS — M47812 Spondylosis without myelopathy or radiculopathy, cervical region: Secondary | ICD-10-CM | POA: Diagnosis not present

## 2022-10-14 DIAGNOSIS — M542 Cervicalgia: Secondary | ICD-10-CM | POA: Diagnosis not present

## 2022-10-28 DIAGNOSIS — H3589 Other specified retinal disorders: Secondary | ICD-10-CM | POA: Diagnosis not present

## 2022-10-28 DIAGNOSIS — H40023 Open angle with borderline findings, high risk, bilateral: Secondary | ICD-10-CM | POA: Diagnosis not present

## 2022-10-28 DIAGNOSIS — H47292 Other optic atrophy, left eye: Secondary | ICD-10-CM | POA: Diagnosis not present

## 2022-11-06 DIAGNOSIS — E559 Vitamin D deficiency, unspecified: Secondary | ICD-10-CM | POA: Diagnosis not present

## 2022-11-06 DIAGNOSIS — M549 Dorsalgia, unspecified: Secondary | ICD-10-CM | POA: Diagnosis not present

## 2022-11-06 DIAGNOSIS — E669 Obesity, unspecified: Secondary | ICD-10-CM | POA: Diagnosis not present

## 2022-11-06 DIAGNOSIS — I10 Essential (primary) hypertension: Secondary | ICD-10-CM | POA: Diagnosis not present

## 2022-11-06 DIAGNOSIS — R7303 Prediabetes: Secondary | ICD-10-CM | POA: Diagnosis not present

## 2022-11-06 DIAGNOSIS — Z79899 Other long term (current) drug therapy: Secondary | ICD-10-CM | POA: Diagnosis not present

## 2022-11-13 DIAGNOSIS — L03012 Cellulitis of left finger: Secondary | ICD-10-CM | POA: Diagnosis not present

## 2022-11-28 DIAGNOSIS — E559 Vitamin D deficiency, unspecified: Secondary | ICD-10-CM | POA: Diagnosis not present

## 2022-11-28 DIAGNOSIS — R7303 Prediabetes: Secondary | ICD-10-CM | POA: Diagnosis not present

## 2022-11-28 DIAGNOSIS — K219 Gastro-esophageal reflux disease without esophagitis: Secondary | ICD-10-CM | POA: Diagnosis not present

## 2022-11-28 DIAGNOSIS — I10 Essential (primary) hypertension: Secondary | ICD-10-CM | POA: Diagnosis not present

## 2022-11-28 DIAGNOSIS — M549 Dorsalgia, unspecified: Secondary | ICD-10-CM | POA: Diagnosis not present

## 2022-11-28 DIAGNOSIS — Z79899 Other long term (current) drug therapy: Secondary | ICD-10-CM | POA: Diagnosis not present

## 2022-11-28 DIAGNOSIS — E669 Obesity, unspecified: Secondary | ICD-10-CM | POA: Diagnosis not present

## 2023-01-01 DIAGNOSIS — M5489 Other dorsalgia: Secondary | ICD-10-CM | POA: Diagnosis not present

## 2023-01-01 DIAGNOSIS — E669 Obesity, unspecified: Secondary | ICD-10-CM | POA: Diagnosis not present

## 2023-01-01 DIAGNOSIS — Z79899 Other long term (current) drug therapy: Secondary | ICD-10-CM | POA: Diagnosis not present

## 2023-01-01 DIAGNOSIS — I10 Essential (primary) hypertension: Secondary | ICD-10-CM | POA: Diagnosis not present

## 2023-01-01 DIAGNOSIS — K219 Gastro-esophageal reflux disease without esophagitis: Secondary | ICD-10-CM | POA: Diagnosis not present

## 2023-01-01 DIAGNOSIS — R7303 Prediabetes: Secondary | ICD-10-CM | POA: Diagnosis not present

## 2023-01-01 DIAGNOSIS — E559 Vitamin D deficiency, unspecified: Secondary | ICD-10-CM | POA: Diagnosis not present

## 2023-01-03 DIAGNOSIS — Z79899 Other long term (current) drug therapy: Secondary | ICD-10-CM | POA: Diagnosis not present

## 2023-01-30 DIAGNOSIS — M5489 Other dorsalgia: Secondary | ICD-10-CM | POA: Diagnosis not present

## 2023-01-30 DIAGNOSIS — I10 Essential (primary) hypertension: Secondary | ICD-10-CM | POA: Diagnosis not present

## 2023-01-30 DIAGNOSIS — K219 Gastro-esophageal reflux disease without esophagitis: Secondary | ICD-10-CM | POA: Diagnosis not present

## 2023-01-30 DIAGNOSIS — Z23 Encounter for immunization: Secondary | ICD-10-CM | POA: Diagnosis not present

## 2023-01-30 DIAGNOSIS — E559 Vitamin D deficiency, unspecified: Secondary | ICD-10-CM | POA: Diagnosis not present

## 2023-01-30 DIAGNOSIS — E669 Obesity, unspecified: Secondary | ICD-10-CM | POA: Diagnosis not present

## 2023-01-30 DIAGNOSIS — Z79899 Other long term (current) drug therapy: Secondary | ICD-10-CM | POA: Diagnosis not present

## 2023-01-30 DIAGNOSIS — R7303 Prediabetes: Secondary | ICD-10-CM | POA: Diagnosis not present

## 2023-02-17 ENCOUNTER — Other Ambulatory Visit: Payer: Self-pay | Admitting: Physician Assistant

## 2023-02-17 DIAGNOSIS — Z1231 Encounter for screening mammogram for malignant neoplasm of breast: Secondary | ICD-10-CM

## 2023-02-26 DIAGNOSIS — Z6832 Body mass index (BMI) 32.0-32.9, adult: Secondary | ICD-10-CM | POA: Diagnosis not present

## 2023-02-26 DIAGNOSIS — E6609 Other obesity due to excess calories: Secondary | ICD-10-CM | POA: Diagnosis not present

## 2023-02-26 DIAGNOSIS — R030 Elevated blood-pressure reading, without diagnosis of hypertension: Secondary | ICD-10-CM | POA: Diagnosis not present

## 2023-02-26 DIAGNOSIS — Z79899 Other long term (current) drug therapy: Secondary | ICD-10-CM | POA: Diagnosis not present

## 2023-02-26 DIAGNOSIS — M5489 Other dorsalgia: Secondary | ICD-10-CM | POA: Diagnosis not present

## 2023-03-07 ENCOUNTER — Ambulatory Visit
Admission: RE | Admit: 2023-03-07 | Discharge: 2023-03-07 | Disposition: A | Discharge status: Home / Self Care | Payer: Medicaid Other | Source: Ambulatory Visit | Attending: Physician Assistant | Admitting: Physician Assistant

## 2023-03-07 DIAGNOSIS — Z1231 Encounter for screening mammogram for malignant neoplasm of breast: Secondary | ICD-10-CM | POA: Diagnosis not present

## 2023-04-01 DIAGNOSIS — R7303 Prediabetes: Secondary | ICD-10-CM | POA: Diagnosis not present

## 2023-04-01 DIAGNOSIS — M129 Arthropathy, unspecified: Secondary | ICD-10-CM | POA: Diagnosis not present

## 2023-04-01 DIAGNOSIS — I10 Essential (primary) hypertension: Secondary | ICD-10-CM | POA: Diagnosis not present

## 2023-04-01 DIAGNOSIS — E559 Vitamin D deficiency, unspecified: Secondary | ICD-10-CM | POA: Diagnosis not present

## 2023-04-01 DIAGNOSIS — Z79899 Other long term (current) drug therapy: Secondary | ICD-10-CM | POA: Diagnosis not present

## 2023-04-01 DIAGNOSIS — E669 Obesity, unspecified: Secondary | ICD-10-CM | POA: Diagnosis not present

## 2023-04-01 DIAGNOSIS — M5489 Other dorsalgia: Secondary | ICD-10-CM | POA: Diagnosis not present

## 2023-04-01 DIAGNOSIS — K219 Gastro-esophageal reflux disease without esophagitis: Secondary | ICD-10-CM | POA: Diagnosis not present

## 2023-04-02 ENCOUNTER — Ambulatory Visit: Payer: Medicaid Other | Admitting: Dermatology

## 2023-04-20 IMAGING — MG MM DIGITAL DIAGNOSTIC UNILAT*L* W/ TOMO W/ CAD
6 series · 6 of 18 positions shown · non-contrast
Comparison: Previous exam(s).

ACR Breast Density Category a: The breast tissue is almost entirely
fatty.

CLINICAL DATA: 60-year-old female presenting for evaluation of a
new lump in the left breast.

EXAM:
DIGITAL DIAGNOSTIC UNILATERAL LEFT MAMMOGRAM WITH TOMOSYNTHESIS AND
CAD; ULTRASOUND LEFT BREAST LIMITED
TECHNIQUE: Left digital diagnostic mammography and breast tomosynthesis was
performed. The images were evaluated with computer-aided detection.;
Targeted ultrasound examination of the left breast was performed.

[L TAN synth-2D]
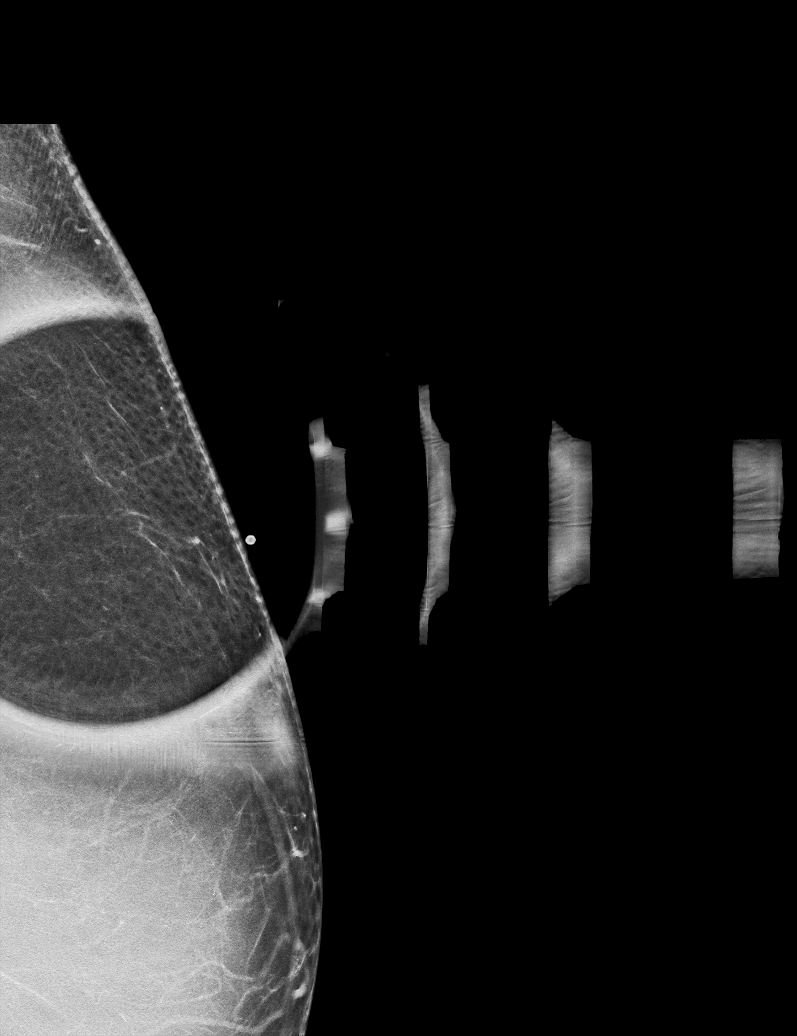

[L CC synth-2D]
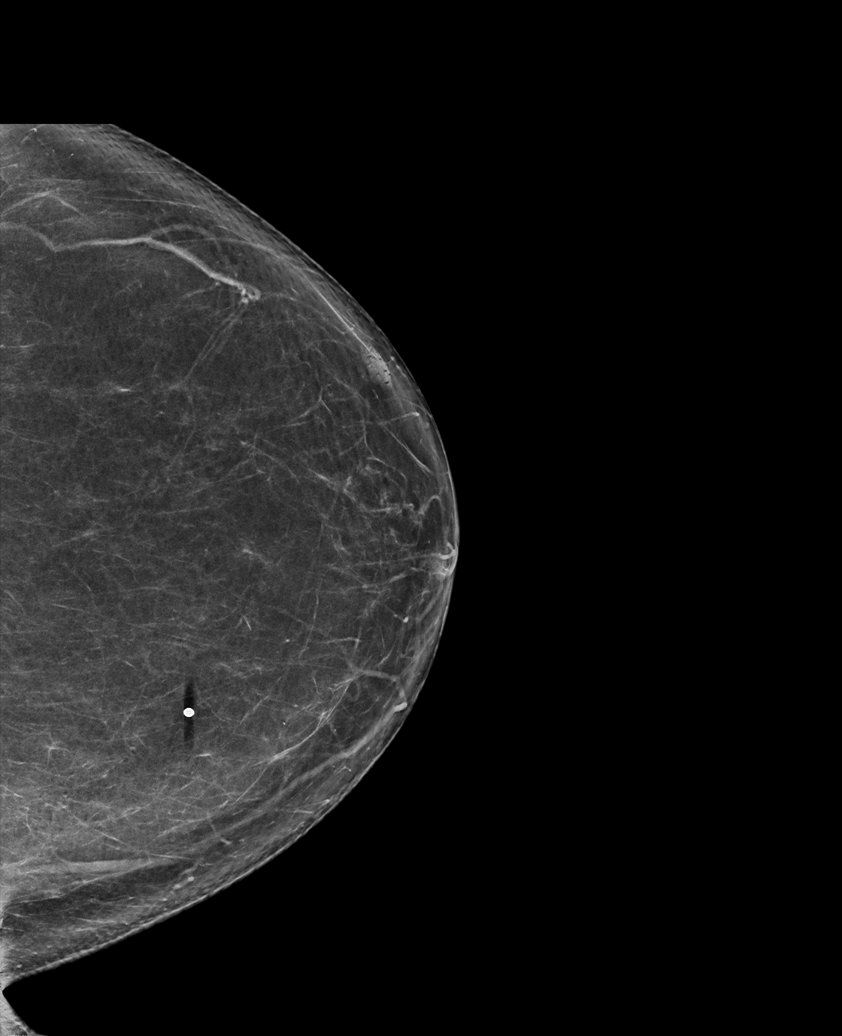

[L MLO synth-2D]
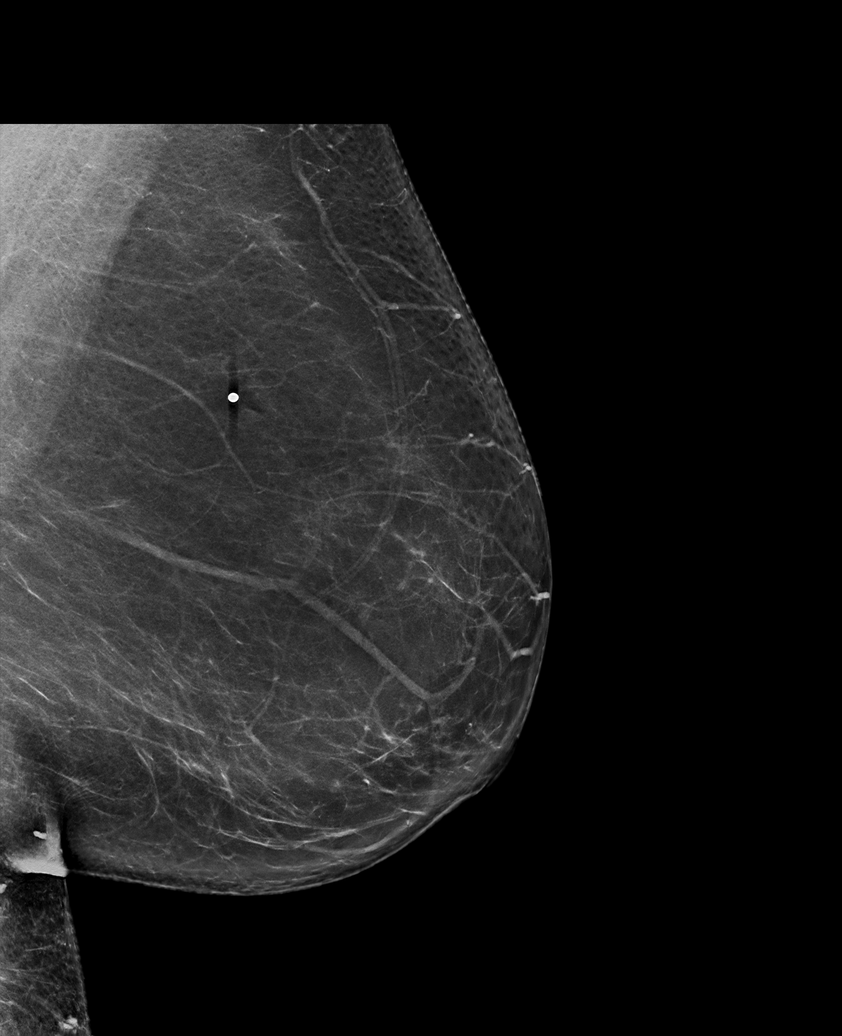

[L MLO tomo · tomo slice 45/90.0]
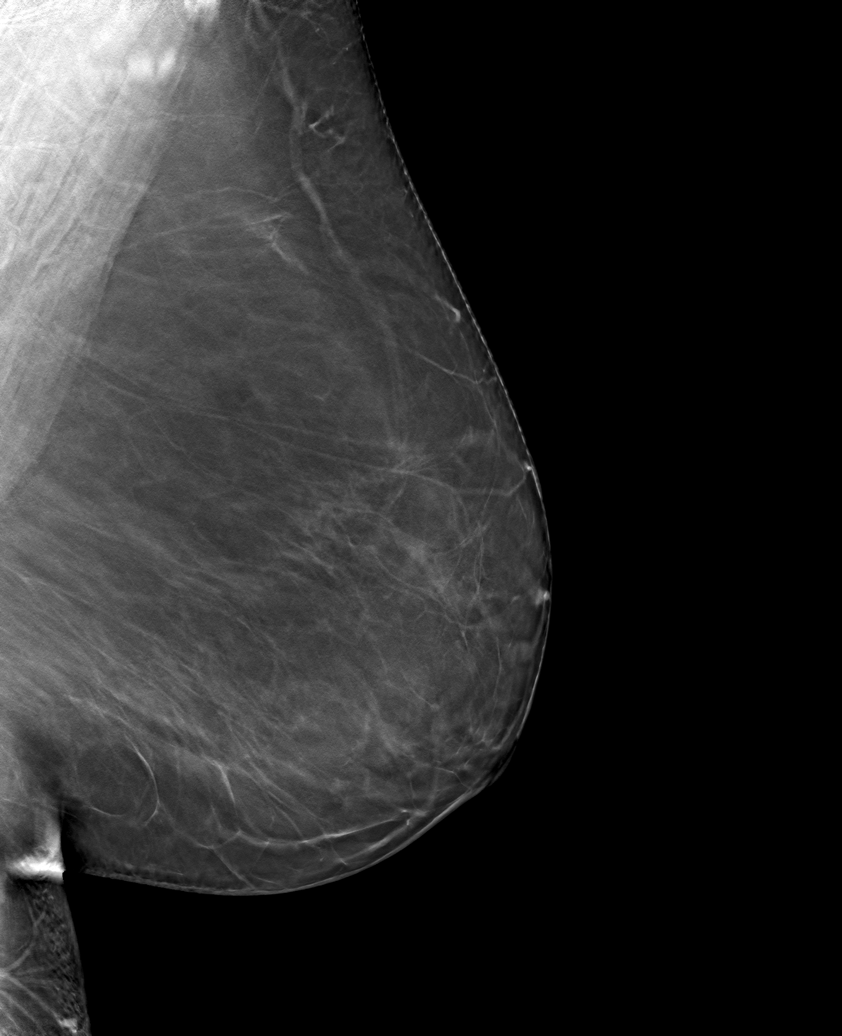

[L TAN tomo · tomo slice 28/55.0]
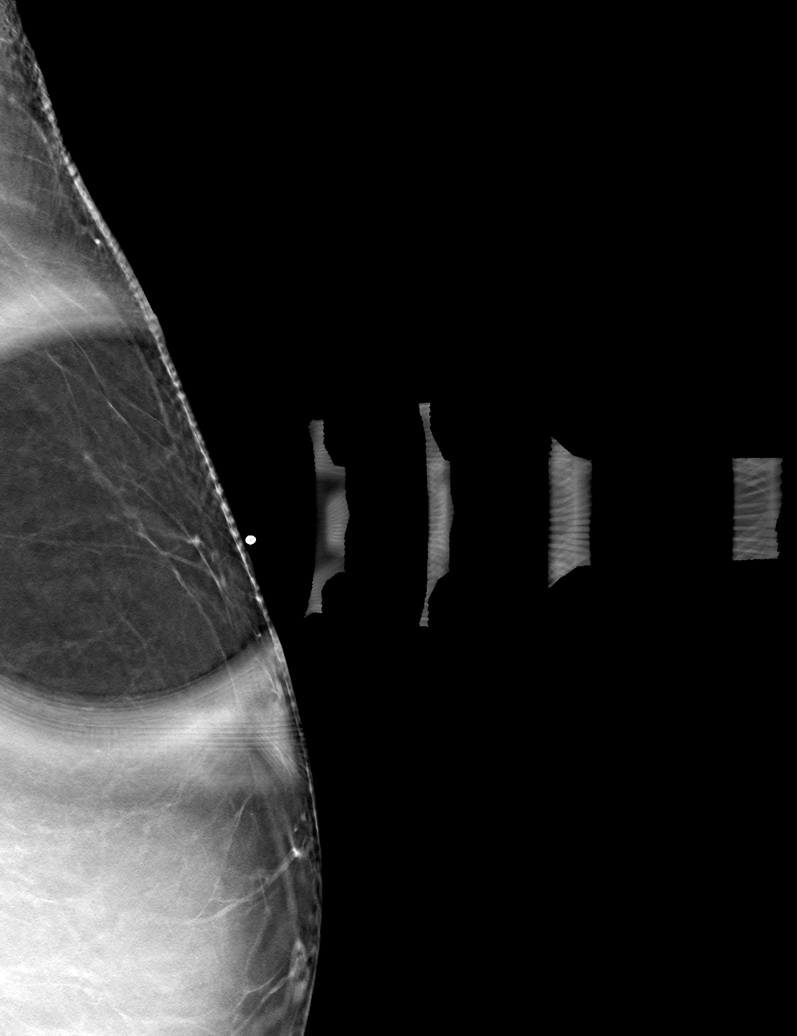

[L CC tomo · tomo slice 41/81.0]
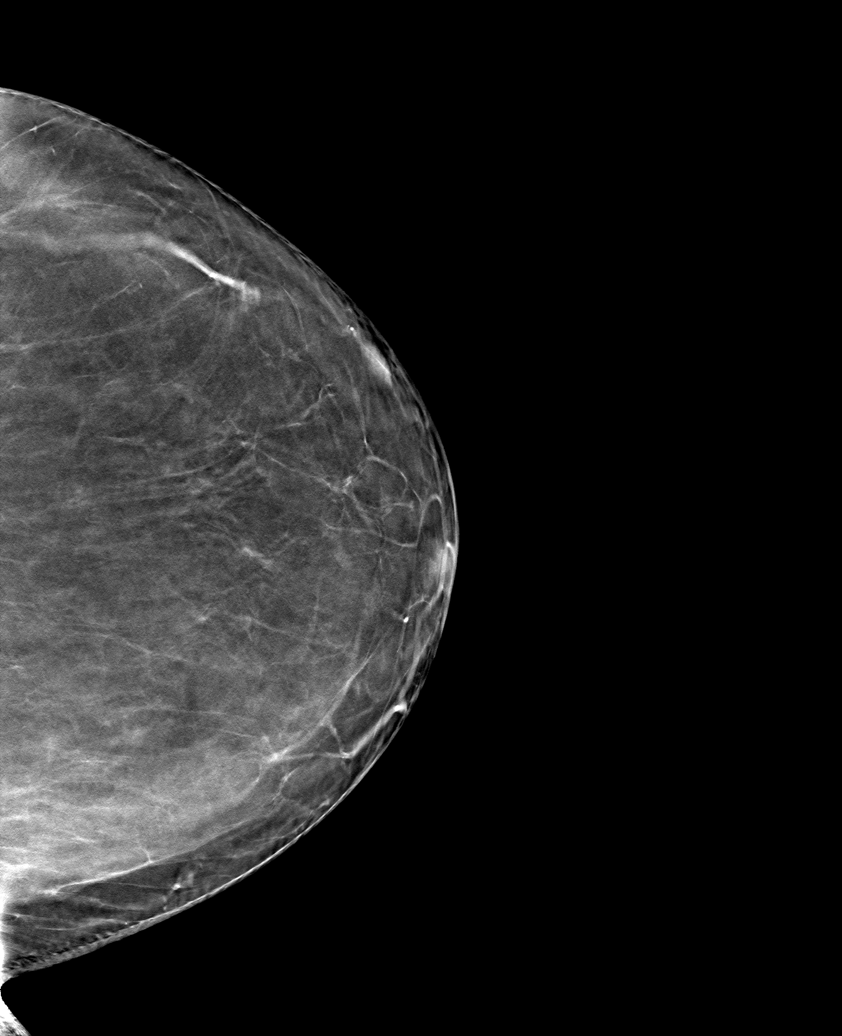

[6 of 18 positions shown; findings below may reference images not displayed]

FINDINGS: Mammogram:

Left breast: A skin BB marks the palpable site of concern reported
by the patient in the upper inner left breast. A spot tangential
view of this area was performed in addition to standard views. There
is no new abnormality at the palpable site or elsewhere in the left
breast suggest presence of malignancy.

On physical exam at the site of concern reported by the patient I
feel a possible ridge of tissue versus rubbery mass. There are no
associated skin changes.

Ultrasound:

Targeted ultrasound is performed at the site of concern reported by
the patient in the left breast at 10 o'clock 9 cm from the nipple
demonstrating a superficial oval circumscribed hyperechoic mass
measuring 1.3 x 0.4 x 0.9 cm. No internal vascularity.
IMPRESSION: Probably benign mass at the palpable site of concern in the left
breast at 10 o'clock, likely either fat necrosis or lipoma.

RECOMMENDATION:
Left breast ultrasound at the time of bilateral mammogram which is
due in January 2022.

I have discussed the findings and recommendations with the patient.
If applicable, a reminder letter will be sent to the patient
regarding the next appointment.

BI-RADS CATEGORY  3: Probably benign.

## 2023-04-29 DIAGNOSIS — R7303 Prediabetes: Secondary | ICD-10-CM | POA: Diagnosis not present

## 2023-04-29 DIAGNOSIS — I10 Essential (primary) hypertension: Secondary | ICD-10-CM | POA: Diagnosis not present

## 2023-04-29 DIAGNOSIS — Z79899 Other long term (current) drug therapy: Secondary | ICD-10-CM | POA: Diagnosis not present

## 2023-04-29 DIAGNOSIS — E669 Obesity, unspecified: Secondary | ICD-10-CM | POA: Diagnosis not present

## 2023-04-29 DIAGNOSIS — M5489 Other dorsalgia: Secondary | ICD-10-CM | POA: Diagnosis not present

## 2023-04-29 DIAGNOSIS — E559 Vitamin D deficiency, unspecified: Secondary | ICD-10-CM | POA: Diagnosis not present

## 2023-04-29 DIAGNOSIS — K219 Gastro-esophageal reflux disease without esophagitis: Secondary | ICD-10-CM | POA: Diagnosis not present

## 2023-05-01 ENCOUNTER — Ambulatory Visit: Payer: Medicaid Other | Admitting: Dermatology

## 2023-05-01 ENCOUNTER — Encounter: Payer: Self-pay | Admitting: Dermatology

## 2023-05-01 VITALS — BP 119/73

## 2023-05-01 DIAGNOSIS — W908XXA Exposure to other nonionizing radiation, initial encounter: Secondary | ICD-10-CM

## 2023-05-01 DIAGNOSIS — Z1283 Encounter for screening for malignant neoplasm of skin: Secondary | ICD-10-CM | POA: Diagnosis not present

## 2023-05-01 DIAGNOSIS — L814 Other melanin hyperpigmentation: Secondary | ICD-10-CM | POA: Diagnosis not present

## 2023-05-01 DIAGNOSIS — D229 Melanocytic nevi, unspecified: Secondary | ICD-10-CM

## 2023-05-01 DIAGNOSIS — L821 Other seborrheic keratosis: Secondary | ICD-10-CM | POA: Diagnosis not present

## 2023-05-01 DIAGNOSIS — L578 Other skin changes due to chronic exposure to nonionizing radiation: Secondary | ICD-10-CM | POA: Diagnosis not present

## 2023-05-01 DIAGNOSIS — D1801 Hemangioma of skin and subcutaneous tissue: Secondary | ICD-10-CM

## 2023-05-01 NOTE — Progress Notes (Signed)
   New Patient Visit   Subjective  Stacy Hatfield is a 62 y.o. female who presents for the following: Skin Cancer Screening and Full Body Skin Exam - No history of skin cancer or abnormal moles. No family history of Melanoma  The patient presents for Total-Body Skin Exam (TBSE) for skin cancer screening and mole check. The patient has spots, moles and lesions to be evaluated, some may be new or changing and the patient may have concern these could be cancer.    The following portions of the chart were reviewed this encounter and updated as appropriate: medications, allergies, medical history  Review of Systems:  No other skin or systemic complaints except as noted in HPI or Assessment and Plan.  Objective  Well appearing patient in no apparent distress; mood and affect are within normal limits.  A full examination was performed including scalp, head, eyes, ears, nose, lips, neck, chest, axillae, abdomen, back, buttocks, bilateral upper extremities, bilateral lower extremities, hands, feet, fingers, toes, fingernails, and toenails. All findings within normal limits unless otherwise noted below.   Relevant physical exam findings are noted in the Assessment and Plan.    Assessment & Plan   SKIN CANCER SCREENING PERFORMED TODAY.  ACTINIC DAMAGE - Chronic condition, secondary to cumulative UV/sun exposure - diffuse scaly erythematous macules with underlying dyspigmentation - Recommend daily broad spectrum sunscreen SPF 30+ to sun-exposed areas, reapply every 2 hours as needed.  - Staying in the shade or wearing long sleeves, sun glasses (UVA+UVB protection) and wide brim hats (4-inch brim around the entire circumference of the hat) are also recommended for sun protection.  - Call for new or changing lesions.  LENTIGINES, SEBORRHEIC KERATOSES, HEMANGIOMAS - Benign normal skin lesions - Benign-appearing - Call for any changes  MELANOCYTIC NEVI - Tan-brown and/or pink-flesh-colored  symmetric macules and papules - Benign appearing on exam today - Observation - Call clinic for new or changing moles - Recommend daily use of broad spectrum spf 30+ sunscreen to sun-exposed areas.          Return in about 1 year (around 04/30/2024) for TBSE.  I, Joanie Coddington, CMA, am acting as scribe for [employer], DO .   Documentation: I have reviewed the above documentation for accuracy and completeness, and I agree with the above.  Langston Reusing, DO

## 2023-05-01 NOTE — Patient Instructions (Signed)
Hello [name],  Thank you for visiting [place] today. We are committed to assisting you in maintaining your skin health and appreciate your proactive approach.  Here is a summary of the key instructions from today's consultation with Dr. Langston Reusing:  - Skin Examination: A comprehensive head-to-toe skin examination was performed to screen for skin cancer and other conditions. Fortunately, no suspicious or precancerous lesions were identified.  - Sun Protection: Your efforts to limit sun exposure are commendable. Please continue applying a daily moisturizer with sunscreen, focusing on areas like the chest that may receive indirect sun exposure.  - Skin Care Routine: Your current use of CeraVe or similar products is effective in keeping your skin well-hydrated. We have provided samples for you to explore new products as desired.  - Observation of Moles and Spots: We did not find any new or concerning moles or growths. Nonetheless, remain vigilant in monitoring your skin and report any new or changing spots.  - Follow-Up: We recommend scheduling a follow-up appointment for a skin check in about a year, or sooner if you notice any new or changing spots.  We hope you find these instructions helpful. Should you have any questions or concerns before your next visit, please feel free to contact our office.  Warm regards,  Dr. [facility] Reusing [facility]   Important Information  Due to recent changes in healthcare laws, you may see results of your pathology and/or laboratory studies on MyChart before the doctors have had a chance to review them. We understand that in some cases there may be results that are confusing or concerning to you. Please understand that not all results are received at the same time and often the doctors may need to interpret multiple results in order to provide you with the best plan of care or course of treatment. Therefore, we ask that you please give [place] 2 business days to  thoroughly review all your results before contacting the office for clarification. Should we see a critical lab result, you will be contacted sooner.   If You Need Anything After Your Visit  If you have any questions or concerns for your doctor, please call our main line at [phone] If no one answers, please leave a voicemail as directed and we will return your call as soon as possible. Messages left after 4 pm will be answered the following business day.   You may also send [place] a message via MyChart. We typically respond to MyChart messages within 1-2 business days.  For prescription refills, please ask your pharmacy to contact our office. Our fax number is [phone].  If you have an urgent issue when the clinic is closed that cannot wait until the next business day, you can page your doctor at the number below.    Please note that while we do our best to be available for urgent issues outside of office hours, we are not available 24/7.   If you have an urgent issue and are unable to reach [place], you may choose to seek medical care at your doctor's office, retail clinic, urgent care center, or emergency room.  If you have a medical emergency, please immediately call 911 or go to the emergency department. In the event of inclement weather, please call our main line at [phone] for an update on the status of any delays or closures.  Dermatology Medication Tips: Please keep the boxes that topical medications come in in order to help keep track of the instructions about where  and how to use these. Pharmacies typically print the medication instructions only on the boxes and not directly on the medication tubes.   If your medication is too expensive, please contact our office at [phone] or send [place] a message through MyChart.   We are unable to tell what your co-pay for medications will be in advance as this is different depending on your insurance coverage. However, we may be able to  find a substitute medication at lower cost or fill out paperwork to get insurance to cover a needed medication.   If a prior authorization is required to get your medication covered by your insurance company, please allow [place] 1-2 business days to complete this process.  Drug prices often vary depending on where the prescription is filled and some pharmacies may offer cheaper prices.  The website www.goodrx.com contains coupons for medications through different pharmacies. The prices here do not account for what the cost may be with help from insurance (it may be cheaper with your insurance), but the website can give you the price if you did not use any insurance.  - You can print the associated coupon and take it with your prescription to the pharmacy.  - You may also stop by our office during regular business hours and pick up a GoodRx coupon card.  - If you need your prescription sent electronically to a different pharmacy, notify our office through [facility] or by phone at [phone]

## 2023-05-01 NOTE — Progress Notes (Deleted)
   New Patient Visit   Subjective  Stacy Hatfield is a 62 y.o. female who presents for the following: ***  Patient states {he/she/they:23295} has *** located at the {LOCATION ON BODY:21951} that {he/she/they:23295} would like to have examined. Patient reports the areas have been there for {NUMBER 1-10:22536} {Time; units week/month/year w plurals:19499}. {He/she (caps):30048} reports the areas {Actions; are/are not:16769} bothersome.Patient rates irritation {NUMBER 1-10:22536} out of 10. {He/she (caps):30048} states that the areas {ACTIONS; HAVE/HAVE NOT:19434} spread. Patient reports {he/she/they:23295} {HAS HAS AOZ:30865} previously been treated for these areas. Patient *** Hx of bx. Patient *** family history of skin cancer(s).  The patient has spots, moles and lesions to be evaluated, some may be new or changing and the patient may have concern these could be cancer.   The following portions of the chart were reviewed this encounter and updated as appropriate: medications, allergies, medical history  Review of Systems:  No other skin or systemic complaints except as noted in HPI or Assessment and Plan.  Objective  Well appearing patient in no apparent distress; mood and affect are within normal limits.  ***A full examination was performed including scalp, head, eyes, ears, nose, lips, neck, chest, axillae, abdomen, back, buttocks, bilateral upper extremities, bilateral lower extremities, hands, feet, fingers, toes, fingernails, and toenails. All findings within normal limits unless otherwise noted below.  ***A focused examination was performed of the following areas: ***  Relevant exam findings are noted in the Assessment and Plan.    Assessment & Plan       No follow-ups on file.  ***  Documentation: I have reviewed the above documentation for accuracy and completeness, and I agree with the above.  Langston Reusing, DO

## 2023-05-01 NOTE — Patient Instructions (Signed)

## 2023-05-16 ENCOUNTER — Other Ambulatory Visit: Payer: Self-pay

## 2023-05-16 ENCOUNTER — Emergency Department (HOSPITAL_BASED_OUTPATIENT_CLINIC_OR_DEPARTMENT_OTHER)
Admission: EM | Admit: 2023-05-16 | Discharge: 2023-05-17 | Disposition: A | Discharge status: Home / Self Care | Payer: Medicaid Other | Attending: Emergency Medicine | Admitting: Emergency Medicine

## 2023-05-16 ENCOUNTER — Encounter (HOSPITAL_BASED_OUTPATIENT_CLINIC_OR_DEPARTMENT_OTHER): Payer: Self-pay | Admitting: Emergency Medicine

## 2023-05-16 ENCOUNTER — Emergency Department (HOSPITAL_BASED_OUTPATIENT_CLINIC_OR_DEPARTMENT_OTHER): Payer: Medicaid Other | Admitting: Radiology

## 2023-05-16 ENCOUNTER — Emergency Department (HOSPITAL_BASED_OUTPATIENT_CLINIC_OR_DEPARTMENT_OTHER): Payer: Medicaid Other

## 2023-05-16 DIAGNOSIS — I700 Atherosclerosis of aorta: Secondary | ICD-10-CM | POA: Diagnosis not present

## 2023-05-16 DIAGNOSIS — I252 Old myocardial infarction: Secondary | ICD-10-CM | POA: Diagnosis not present

## 2023-05-16 DIAGNOSIS — R9431 Abnormal electrocardiogram [ECG] [EKG]: Secondary | ICD-10-CM

## 2023-05-16 DIAGNOSIS — Z79899 Other long term (current) drug therapy: Secondary | ICD-10-CM | POA: Diagnosis not present

## 2023-05-16 DIAGNOSIS — R079 Chest pain, unspecified: Secondary | ICD-10-CM

## 2023-05-16 DIAGNOSIS — I10 Essential (primary) hypertension: Secondary | ICD-10-CM | POA: Diagnosis not present

## 2023-05-16 DIAGNOSIS — R7989 Other specified abnormal findings of blood chemistry: Secondary | ICD-10-CM | POA: Diagnosis not present

## 2023-05-16 DIAGNOSIS — M549 Dorsalgia, unspecified: Secondary | ICD-10-CM | POA: Insufficient documentation

## 2023-05-16 DIAGNOSIS — R0789 Other chest pain: Secondary | ICD-10-CM | POA: Diagnosis not present

## 2023-05-16 LAB — TROPONIN I (HIGH SENSITIVITY)
Troponin I (High Sensitivity): 2 ng/L (ref <18)
Troponin I (High Sensitivity): 2 ng/L (ref <18)

## 2023-05-16 LAB — CBC
HCT: 40.6 % (ref 36.0–46.0)
Hemoglobin: 13.5 g/dL (ref 12.0–15.0)
MCH: 30 pg (ref 26.0–34.0)
MCHC: 33.3 g/dL (ref 30.0–36.0)
MCV: 90.2 fL (ref 80.0–100.0)
Platelets: 224 10*3/uL (ref 150–400)
RBC: 4.5 MIL/uL (ref 3.87–5.11)
RDW: 12.4 % (ref 11.5–15.5)
WBC: 7 10*3/uL (ref 4.0–10.5)
nRBC: 0 % (ref 0.0–0.2)

## 2023-05-16 LAB — BASIC METABOLIC PANEL
Anion gap: 8 (ref 5–15)
BUN: 11 mg/dL (ref 6–20)
CO2: 29 mmol/L (ref 22–32)
Calcium: 9.3 mg/dL (ref 8.9–10.3)
Chloride: 103 mmol/L (ref 98–111)
Creatinine, Ser: 0.59 mg/dL (ref 0.44–1.00)
GFR, Estimated: >60 mL/min (ref >60)
Glucose, Bld: 131 mg/dL — ABNORMAL HIGH (ref 70–99)
Potassium: 3.5 mmol/L (ref 3.5–5.1)
Sodium: 140 mmol/L (ref 135–145)

## 2023-05-16 LAB — D-DIMER, QUANTITATIVE: D-Dimer, Quant: 0.95 ug{FEU}/mL — ABNORMAL HIGH (ref 0.00–0.50)

## 2023-05-16 MED ORDER — MORPHINE SULFATE (PF) 4 MG/ML IV SOLN: 6.0000 mg | Freq: Once | INTRAVENOUS | Status: DC

## 2023-05-16 MED ORDER — IOHEXOL 350 MG/ML SOLN
100.0000 mL | Freq: Once | INTRAVENOUS | Status: AC | PRN
  Administered 2023-05-16: 75 mL via INTRAVENOUS

## 2023-05-16 NOTE — ED Triage Notes (Signed)
Pt c/o CP x 1 week pta, endorses "fluttering yesterday, and upper back pain daily with shob

## 2023-05-16 NOTE — ED Provider Notes (Signed)
[place] EMERGENCY DEPARTMENT AT [facility] Provider Note   CSN: [id number] Arrival date & time: 05/16/23  1629     History  Chief Complaint  Patient presents with   Back Pain    Stacy Hatfield is a 62 y.o. female.  Patient is a 62 yo female with pmh of htn and previous smoking hx 17 years ago presenting for chest pain. Patient admits to acute left sided chest pain that started last week, last 1-2 minutes, severe, without nausea/vomiting/diaphoresis.  Patient was standing making coffee when pain occurred. The next day patient noticed right sided posterior upper back pain near the right shoulder blade angle, described as achy, constant, that has not improved. No repeat chest pain. Back pain still present. No prior hx of blood clots in legs or lungs. No blood thinner use. Denies leg swelling, calf pain, recent surgeries, or prolonged immobilization.   No prior MI.    The history is provided by the patient. No language interpreter was used.  Back Pain Associated symptoms: chest pain (one week ago)   Associated symptoms: no abdominal pain, no dysuria and no fever        Home Medications Prior to Admission medications   Medication Sig Start Date End Date Taking? Authorizing Provider  HYDROcodone-acetaminophen (NORCO) 10-325 MG tablet Take 1 tablet by mouth 3 (three) times daily as needed for pain. 02/04/22     HYDROcodone-acetaminophen (NORCO) 10-325 MG tablet Take 1 tablet by mouth 3 (three) times daily as needed for pain. 03/24/22     HYDROcodone-acetaminophen (NORCO) 10-325 MG tablet Take 1 tablet by mouth 3 times daily as needed for pain 07/16/22     lisinopril (ZESTRIL) 20 MG tablet Take 1 tablet (20 mg total) by mouth daily. Please fill as a 90 day supply 10/19/21   Claiborne Rigg, NP      Allergies    Patient has no known allergies.    Review of Systems   Review of Systems  Constitutional:  Negative for chills and fever.  HENT:  Negative for ear pain and  sore throat.   Eyes:  Negative for pain and visual disturbance.  Respiratory:  Negative for cough and shortness of breath.   Cardiovascular:  Positive for chest pain (one week ago). Negative for palpitations.  Gastrointestinal:  Negative for abdominal pain and vomiting.  Genitourinary:  Negative for dysuria and hematuria.  Musculoskeletal:  Positive for back pain. Negative for arthralgias.  Skin:  Negative for color change and rash.  Neurological:  Negative for seizures and syncope.  All other systems reviewed and are negative.   Physical Exam Updated Vital Signs BP (!) 147/62   Pulse (!) 56   Temp 98.3 F (36.8 C) (Oral)   Resp 18   Wt 83.9 kg   LMP 10/18/2012   SpO2 97%   BMI 34.96 kg/m  Physical Exam Vitals and nursing note reviewed.  Constitutional:      General: She is not in acute distress.    Appearance: She is well-developed.  HENT:     Head: Normocephalic and atraumatic.  Eyes:     Conjunctiva/sclera: Conjunctivae normal.  Cardiovascular:     Rate and Rhythm: Normal rate and regular rhythm.     Heart sounds: No murmur heard. Pulmonary:     Effort: Pulmonary effort is normal. No respiratory distress.     Breath sounds: Normal breath sounds.  Abdominal:     Palpations: Abdomen is soft.     Tenderness: There  is no abdominal tenderness.  Musculoskeletal:        General: No swelling.     Cervical back: Neck supple.  Skin:    General: Skin is warm and dry.     Capillary Refill: Capillary refill takes less than 2 seconds.  Neurological:     Mental Status: She is alert.  Psychiatric:        Mood and Affect: Mood normal.     ED Results / Procedures / Treatments   Labs (all labs ordered are listed, but only abnormal results are displayed) Labs Reviewed  BASIC METABOLIC PANEL - Abnormal; Notable for the following components:      Result Value   Glucose, Bld 131 (*)    All other components within normal limits  D-DIMER, QUANTITATIVE - Abnormal; Notable for  the following components:   D-Dimer, Quant 0.95 (*)    All other components within normal limits  CBC  TROPONIN I (HIGH SENSITIVITY)  TROPONIN I (HIGH SENSITIVITY)    EKG None  Radiology CT Angio Chest PE W and/or Wo Contrast Result Date: 05/16/2023 CLINICAL DATA:  Chest pain for 1 week with positive D-dimer, initial encounter EXAM: CT ANGIOGRAPHY CHEST WITH CONTRAST TECHNIQUE: Multidetector CT imaging of the chest was performed using the standard protocol during bolus administration of intravenous contrast. Multiplanar CT image reconstructions and MIPs were obtained to evaluate the vascular anatomy. RADIATION DOSE REDUCTION: This exam was performed according to the departmental dose-optimization program which includes automated exposure control, adjustment of the mA and/or kV according to patient size and/or use of iterative reconstruction technique. CONTRAST:  75mL OMNIPAQUE IOHEXOL 350 MG/ML SOLN COMPARISON:  Chest x-ray from earlier in the same day. FINDINGS: Cardiovascular: Thoracic aorta demonstrates atherosclerotic calcifications without aneurysmal dilatation or dissection. No cardiac enlargement is noted. The pulmonary artery shows a normal branching pattern bilaterally. No filling defect to suggest pulmonary embolism is identified. No coronary calcifications are seen. Mediastinum/Nodes: Thoracic inlet is within normal limits. No hilar or mediastinal adenopathy is noted. The esophagus as visualized is within normal limits. Lungs/Pleura: Lungs are clear. No pleural effusion or pneumothorax. Upper Abdomen: Visualized upper abdomen is within normal limits. Musculoskeletal: No acute bony abnormality is noted. Review of the MIP images confirms the above findings. IMPRESSION: No evidence of pulmonary emboli.  No acute abnormality noted. Electronically Signed   By: Alcide Clever M.D.   On: 05/16/2023 23:47   DG Chest 2 View Result Date: 05/16/2023 CLINICAL DATA:  Chest pain. EXAM: CHEST - 2 VIEW  COMPARISON:  04/08/2019. FINDINGS: Bilateral lung fields are clear. Bilateral costophrenic angles are clear. Normal cardio-mediastinal silhouette. No acute osseous abnormalities. The soft tissues are within normal limits. IMPRESSION: No active cardiopulmonary disease. Electronically Signed   By: Jules Schick M.D.   On: 05/16/2023 17:21    Procedures Procedures    Medications Ordered in ED Medications  iohexol (OMNIPAQUE) 350 MG/ML injection 100 mL (75 mLs Intravenous Contrast Given 05/16/23 2329)    ED Course/ Medical Decision Making/ A&P                                 Medical Decision Making Amount and/or Complexity of Data Reviewed Labs: ordered. Radiology: ordered.  Risk Prescription drug management.   62 yo female with pmh of htn and previous smoking hx 17 years ago presenting for chest pain.  Patient is alert and oriented x 3, no acute distress, afebrile, stable vital  signs.  Physical exam demonstrates equal bilateral breath sounds with no adventitious lung sounds.  ECG is concerning for Q waves in the lateral leads.  No previous EKGs to compare.  Patient denies previous history of myocardial infarction.  She is chest pain-free at this time however is having lingering back pain since her initial chest pain 1 week ago.  She does have stable troponins and electrolytes.  D-dimer is positive.  Will order CT pulmonary embolism study to rule out PE.  Otherwise needs close follow-up with cardiology.  Ambulatory referral sent.  Risk factors of hypertension and previous smoking history.  CT PE stable.  Recommending daily aspirin at this time.  Will defer to cardiology for further recommendations.  Patient in no distress and overall condition improved here in the ED. Detailed discussions were had with the patient regarding current findings, and need for close f/u with PCP or on call doctor. The patient has been instructed to return immediately if the symptoms worsen in any way for  re-evaluation. Patient verbalized understanding and is in agreement with current care plan. All questions answered prior to discharge.        Final Clinical Impression(s) / ED Diagnoses Final diagnoses:  Chest pain, unspecified type  Q waves suggestive of previous myocardial infarction    Rx / DC Orders ED Discharge Orders          Ordered    Ambulatory referral to Cardiology        05/16/23 2306              Franne Forts, DO 05/17/23 0002

## 2023-05-16 NOTE — Discharge Instructions (Addendum)
EKG is concerning for Q waves which may demonstrate a possible myocardial infarction or heart attack from earlier this week.  Today's heart markers are within normal limits.  Recommend close follow-up with cardiology.  Ambulatory referral sent.

## 2023-05-20 NOTE — Progress Notes (Signed)
 Cardiology Office Note:  .   Date:  05/23/2023  ID:  Stacy Hatfield, DOB 11-14-1960, MRN [id number] PCP: Darra Glendia RIGGERS  [facility] Providers Cardiologist:  None { History of Present Illness: .   Stacy Hatfield is a 62 y.o. female with history of hypertension who presents for the evaluation of chest pain at the request of Long, Glendia, PA-C.  Patient was seen in the emergency room on 05/16/2023 for acute left-sided chest pain.  Troponins were negative.  CT PE study showed no evidence of coronary calcium  or pulmonary embolism.   History of Present Illness   Stacy Hatfield, a 62 year old female with a history of hypertension, presents for evaluation of chest pain. She was seen in the emergency room on 12/27 for acute chest pain. The pain occurred 2 weeks prior to the ER visit.  The pain was sharp, lasted for about two minutes, and made her sit down. It occurred while she was standing and making coffee, and it resolved on its own after about three to four minutes. She has not had any similar episodes since then. She has a strong family history of heart disease, with her father having passed away from it and both her brother and sister having it. She is a former smoker and does not drink alcohol or use drugs. 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 However, she does get short of breath with other activities. She is not sure if this is due to the weight she has gained or if it is related to her heart. LDL 163.       T chol 239, HDL 55, LDL 163, TG 119 A1c 5.8 CT PE study 05/16/2023 -> No CAC    Problem List HTN    ROS: All other ROS reviewed and negative. Pertinent positives noted in the HPI.     Studies Reviewed: SABRA   EKG Interpretation Date/Time:  Friday May 23 2023 08:41:27 EST Ventricular Rate:  58 PR Interval:  170 QRS Duration:  86 QT Interval:  426 QTC Calculation: 418 R Axis:   27  Text  Interpretation: Sinus bradycardia with sinus arrhythmia Low voltage QRS Nonspecific T wave abnormality Confirmed by Barbaraann Kotyk [phone]) on 05/23/2023 8:46:37 AM   Physical Exam:   VS:  BP 118/70   Pulse (!) 58   Ht 5' 1 (1.549 m)   Wt 183 lb (83 kg)   LMP 10/18/2012   SpO2 94%   BMI 34.58 kg/m    Wt Readings from Last 3 Encounters:  05/23/23 183 lb (83 kg)  05/16/23 185 lb (83.9 kg)  11/06/21 173 lb 1.6 oz (78.5 kg)    GEN: Well nourished, well developed in no acute distress NECK: No JVD; No carotid bruits CARDIAC: RRR, no murmurs, rubs, gallops RESPIRATORY:  Clear to auscultation without rales, wheezing or rhonchi  ABDOMEN: Soft, non-tender, non-distended EXTREMITIES:  No edema; No deformity  ASSESSMENT AND PLAN: .   Assessment and Plan    Chest Pain, possible cardiac  Single episode of sharp left-sided chest pain two weeks ago. Negative workup in ER including normal EKG and troponins. No recurrence of symptoms. -Order coronary CT angiography to evaluate for coronary artery disease. -no need for BB due to HR 58 bpm -Order echocardiogram to assess cardiac function.  Hypertension Well controlled on medication. -Continue current antihypertensive regimen.   Sinus bradycardia with sinus arrhythmia -normal. Echo and CCTA  as above.   Hyperlipidemia LDL 163, strong family history of heart disease. -Consider initiation of cholesterol-lowering medication pending results of coronary CT angiography.  Shortness of Breath Reports shortness of breath with activity. No signs of heart failure on examination. -Assess further with echocardiogram.  Follow-up As needed based on test results.              Follow-up: Return if symptoms worsen or fail to improve.   Signed, Darryle DASEN. Barbaraann, MD, [facility]  [address] [place], [place] [address] [phone]  9:00 AM

## 2023-05-23 ENCOUNTER — Ambulatory Visit: Payer: Medicaid Other | Attending: Cardiovascular Disease | Admitting: Cardiovascular Disease

## 2023-05-23 ENCOUNTER — Encounter: Payer: Self-pay | Admitting: Cardiovascular Disease

## 2023-05-23 VITALS — BP 118/70 | HR 58 | Ht 61.0 in | Wt 183.0 lb

## 2023-05-23 DIAGNOSIS — R079 Chest pain, unspecified: Secondary | ICD-10-CM | POA: Diagnosis not present

## 2023-05-23 DIAGNOSIS — R0602 Shortness of breath: Secondary | ICD-10-CM | POA: Diagnosis not present

## 2023-05-23 DIAGNOSIS — R001 Bradycardia, unspecified: Secondary | ICD-10-CM | POA: Diagnosis not present

## 2023-05-23 NOTE — Patient Instructions (Signed)
 Medication Instructions:  No changes  *If you need a refill on your cardiac medications before your next appointment, please call your pharmacy*   Lab Work: None If you have labs (blood work) drawn today and your tests are completely normal, you will receive your results only by: MyChart Message (if you have MyChart) OR A paper copy in the mail If you have any lab test that is abnormal or we need to change your treatment, we will call you to review the results.   Testing/Procedures:   Your cardiac CT will be scheduled at one of the below locations:   [facility] [address] [place], [place] [address] [phone]  If scheduled at [facility], please arrive at the [facility] and Children's Entrance (Entrance C2) of [facility] 30 minutes prior to test start time. You can use the FREE valet parking offered at entrance C (encouraged to control the heart rate for the test)  Proceed to the [facility] Radiology Department (first floor) to check-in and test prep.  All radiology patients and guests should use entrance C2 at [facility], accessed from [facility], even though the hospital's physical address listed is [address].    If scheduled at [facility] or [facility], please arrive 15 mins early for check-in and test prep.  There is spacious parking and easy access to the radiology department from the [facility] Heart and Vascular entrance. Please enter here and check-in with the desk attendant.   Please follow these instructions carefully (unless otherwise directed):  An IV will be required for this test and Nitroglycerin  will be given.  Hold all erectile dysfunction medications at least 3 days (72 hrs) prior to test. (Ie viagra, cialis, sildenafil, tadalafil, etc)   On the Night Before the Test: Be sure to Drink plenty of water. Do not consume any caffeinated/decaffeinated  beverages or chocolate 12 hours prior to your test. Do not take any antihistamines 12 hours prior to your test. If the patient has contrast allergy: Patient will need a prescription for Prednisone and very clear instructions (as follows): Prednisone 50 mg - take 13 hours prior to test Take another Prednisone 50 mg 7 hours prior to test Take another Prednisone 50 mg 1 hour prior to test Take Benadryl 50 mg 1 hour prior to test Patient must complete all four doses of above prophylactic medications. Patient will need a ride after test due to Benadryl.  On the Day of the Test: Drink plenty of water until 1 hour prior to the test. Do not eat any food 1 hour prior to test. You may take your regular medications prior to the test.  Take metoprolol (Lopressor) two hours prior to test. If you take Furosemide/Hydrochlorothiazide/Spironolactone/Chlorthalidone, please HOLD on the morning of the test. Patients who wear a continuous glucose monitor MUST remove the device prior to scanning. FEMALES- please wear underwire-free bra if available, avoid dresses & tight clothing  *For Clinical Staff only. Please instruct patient the following:* Heart Rate Medication Recommendations for Cardiac CT  Resting HR < 50 bpm  No medication  Resting HR 50-60 bpm and BP >110/50 mmHG   Consider Metoprolol tartrate 25 mg PO 90-120 min prior to scan  Resting HR 60-65 bpm and BP >110/50 mmHG  Metoprolol tartrate 50 mg PO 90-120 minutes prior to scan   Resting HR > 65 bpm and BP >110/50 mmHG  Metoprolol tartrate 100 mg PO 90-120 minutes  prior to scan  Consider Ivabradine 10-15 mg PO or a calcium  channel blocker for resting HR >60 bpm and contraindication to metoprolol tartrate  Consider Ivabradine 10-15 mg PO in combination with metoprolol tartrate for HR >80 bpm        After the Test: Drink plenty of water. After receiving IV contrast, you may experience a mild flushed feeling. This is normal. On occasion, you may  experience a mild rash up to 24 hours after the test. This is not dangerous. If this occurs, you can take Benadryl 25 mg and increase your fluid intake. If you experience trouble breathing, this can be serious. If it is severe call 911 IMMEDIATELY. If it is mild, please call our office.  We will call to schedule your test 2-4 weeks out understanding that some insurance companies will need an authorization prior to the service being performed.   For more information and frequently asked questions, please visit our website : [email or web address]  For non-scheduling related questions, please contact the cardiac imaging nurse navigator should you have any questions/concerns: Cardiac Imaging Nurse Navigators Direct Office Dial: [phone]   For scheduling needs, including cancellations and rescheduling, please call Brittany, [phone].   Echo will be scheduled at [address].  Your physician has requested that you have an echocardiogram. Echocardiography is a painless test that uses sound waves to create images of your heart. It provides your doctor with information about the size and shape of your heart and how well your heart's chambers and valves are working. This procedure takes approximately one hour. There are no restrictions for this procedure. Please do NOT wear cologne, perfume, aftershave, or lotions (deodorant is allowed). Please arrive 15 minutes prior to your appointment time.  Follow-Up: At [facility], you and your health needs are our priority.  As part of our continuing mission to provide you with exceptional heart care, we have created designated Provider Care Teams.  These Care Teams include your primary Cardiologist (physician) and Advanced Practice Providers (APPs -  Physician Assistants and Nurse Practitioners) who all work together to provide you with the care you need, when you need it.  We recommend signing up for the patient portal  called MyChart.  Sign up information is provided on this After Visit Summary.  MyChart is used to connect with patients for Virtual Visits (Telemedicine).  Patients are able to view lab/test results, encounter notes, upcoming appointments, etc.  Non-urgent messages can be sent to your provider as well.   To learn more about what you can do with MyChart, go to [email or web address].    Your next appointment:   Follow up as needed  Provider:   Darryle Decent MD

## 2023-05-27 DIAGNOSIS — I10 Essential (primary) hypertension: Secondary | ICD-10-CM | POA: Diagnosis not present

## 2023-05-27 DIAGNOSIS — K219 Gastro-esophageal reflux disease without esophagitis: Secondary | ICD-10-CM | POA: Diagnosis not present

## 2023-05-27 DIAGNOSIS — M5489 Other dorsalgia: Secondary | ICD-10-CM | POA: Diagnosis not present

## 2023-05-27 DIAGNOSIS — Z79899 Other long term (current) drug therapy: Secondary | ICD-10-CM | POA: Diagnosis not present

## 2023-05-27 DIAGNOSIS — E559 Vitamin D deficiency, unspecified: Secondary | ICD-10-CM | POA: Diagnosis not present

## 2023-05-27 DIAGNOSIS — R7303 Prediabetes: Secondary | ICD-10-CM | POA: Diagnosis not present

## 2023-05-27 DIAGNOSIS — E669 Obesity, unspecified: Secondary | ICD-10-CM | POA: Diagnosis not present

## 2023-05-30 ENCOUNTER — Ambulatory Visit: Payer: Self-pay

## 2023-05-30 ENCOUNTER — Telehealth: Payer: Medicaid Other | Admitting: Physician Assistant

## 2023-05-30 DIAGNOSIS — R6889 Other general symptoms and signs: Secondary | ICD-10-CM | POA: Diagnosis not present

## 2023-05-30 MED ORDER — BENZONATATE 100 MG PO CAPS: 100.0000 mg | ORAL_CAPSULE | Freq: Three times a day (TID) | ORAL | 0 refills | Status: AC | PRN

## 2023-05-30 MED ORDER — OSELTAMIVIR PHOSPHATE 75 MG PO CAPS: 75.0000 mg | ORAL_CAPSULE | Freq: Two times a day (BID) | ORAL | 0 refills | Status: AC

## 2023-05-30 NOTE — Progress Notes (Signed)
 Virtual Visit Consent   Stacy Hatfield, you are scheduled for a virtual visit with a [facility] provider today. Just as with appointments in the office, your consent must be obtained to participate. Your consent will be active for this visit and any virtual visit you may have with one of our providers in the next 365 days. If you have a MyChart account, a copy of this consent can be sent to you electronically.  As this is a virtual visit, video technology does not allow for your provider to perform a traditional examination. This may limit your provider's ability to fully assess your condition. If your provider identifies any concerns that need to be evaluated in person or the need to arrange testing (such as labs, EKG, etc.), we will make arrangements to do so. Although advances in technology are sophisticated, we cannot ensure that it will always work on either your end or our end. If the connection with a video visit is poor, the visit may have to be switched to a telephone visit. With either a video or telephone visit, we are not always able to ensure that we have a secure connection.  By engaging in this virtual visit, you consent to the provision of healthcare and authorize for your insurance to be billed (if applicable) for the services provided during this visit. Depending on your insurance coverage, you may receive a charge related to this service.  I need to obtain your verbal consent now. Are you willing to proceed with your visit today? Stacy Hatfield has provided verbal consent on 05/30/2023 for a virtual visit (video or telephone). Harlene PEDLAR Ward, PA-C  Date: 05/30/2023 6:28 PM  Virtual Visit via Video Note   I, Harlene PEDLAR Ward, connected with  Stacy Hatfield  ([id number], December 17, 1960) on 05/30/23 at  7:00 PM EST by a video-enabled telemedicine application and verified that I am speaking with the correct person using two identifiers.  Location: Patient: Virtual Visit Location Patient:  Home Provider: Virtual Visit Location Provider: Home Office   I discussed the limitations of evaluation and management by telemedicine and the availability of in person appointments. The patient expressed understanding and agreed to proceed.    History of Present Illness: Stacy Hatfield is a 63 y.o. who identifies as a female who was assigned female at birth, and is being seen today for cough, congestion, fever that started earlier today.  Subjective fever.  Pt reports she has been at her sons house and several people tested positive for flu.  Denies shortness, wheezing. She has been taking Tylenol  with some relief.   HPI: HPI  Problems:  Patient Active Problem List   Diagnosis Date Noted   ALLERGIC RHINITIS CAUSE UNSPECIFIED 09/17/2007   BACK PAIN, LUMBAR 09/17/2007    Allergies: No Known Allergies Medications:  Current Outpatient Medications:    Azelastine HCl 0.15 % SOLN, , Disp: , Rfl:    HYDROcodone -acetaminophen  (NORCO) 10-325 MG tablet, Take 1 tablet by mouth 3 (three) times daily as needed for pain. (Patient not taking: Reported on 05/23/2023), Disp: 90 tablet, Rfl: 0   HYDROcodone -acetaminophen  (NORCO) 10-325 MG tablet, Take 1 tablet by mouth 3 (three) times daily as needed for pain. (Patient not taking: Reported on 05/23/2023), Disp: 90 tablet, Rfl: 0   HYDROcodone -acetaminophen  (NORCO) 10-325 MG tablet, Take 1 tablet by mouth 3 times daily as needed for pain, Disp: 90 tablet, Rfl: 0   lisinopril  (ZESTRIL ) 20 MG tablet, Take 1 tablet (20 mg total)  by mouth daily. Please fill as a 90 day supply, Disp: 90 tablet, Rfl: 3   Vitamin D, Ergocalciferol, (DRISDOL) 1.25 MG (50000 UNIT) CAPS capsule, Take 50,000 Units by mouth once a week., Disp: , Rfl:   Observations/Objective: Patient is well-developed, well-nourished in no acute distress.  Resting comfortably at home.  Head is normocephalic, atraumatic.  No labored breathing.  Speech is clear and coherent with logical content.  Patient  is alert and oriented at baseline.    Assessment and Plan: 1. Flu-like symptoms (Primary)  Pt with positive exposure to flu, sx onset 24 hours.  Tamiflu  sent to pharmacy as well as tessalon  for cough.  Pt in no acute distress, speaking in complete sentences.  In person evaluation precautions givne.   Follow Up Instructions: I discussed the assessment and treatment plan with the patient. The patient was provided an opportunity to ask questions and all were answered. The patient agreed with the plan and demonstrated an understanding of the instructions.  A copy of instructions were sent to the patient via MyChart unless otherwise noted below.   Patient has requested to receive PHI (AVS, Work Notes, etc) pertaining to this video visit through e-mail as they are currently without active MyChart. They have voiced understand that email is not considered secure and their health information could be viewed by someone other than the patient.   The patient was advised to call back or seek an in-person evaluation if the symptoms worsen or if the condition fails to improve as anticipated.    Harlene PEDLAR Ward, PA-C

## 2023-05-30 NOTE — Patient Instructions (Signed)
  Stacy Hatfield, thank you for joining Harlene PEDLAR Ward, PA-C for today's virtual visit.  While this provider is not your primary care provider (PCP), if your PCP is located in our provider database this encounter information will be shared with them immediately following your visit.   A [facility] MyChart account gives you access to today's visit and all your visits, tests, and labs performed at [facility]  click here if you don't have a [facility] MyChart account or go to [email or web address]  Consent: (Patient) Stacy Hatfield provided verbal consent for this virtual visit at the beginning of the encounter.  Current Medications:  Current Outpatient Medications:    Azelastine HCl 0.15 % SOLN, , Disp: , Rfl:    HYDROcodone -acetaminophen  (NORCO) 10-325 MG tablet, Take 1 tablet by mouth 3 (three) times daily as needed for pain. (Patient not taking: Reported on 05/23/2023), Disp: 90 tablet, Rfl: 0   HYDROcodone -acetaminophen  (NORCO) 10-325 MG tablet, Take 1 tablet by mouth 3 (three) times daily as needed for pain. (Patient not taking: Reported on 05/23/2023), Disp: 90 tablet, Rfl: 0   HYDROcodone -acetaminophen  (NORCO) 10-325 MG tablet, Take 1 tablet by mouth 3 times daily as needed for pain, Disp: 90 tablet, Rfl: 0   lisinopril  (ZESTRIL ) 20 MG tablet, Take 1 tablet (20 mg total) by mouth daily. Please fill as a 90 day supply, Disp: 90 tablet, Rfl: 3   Vitamin D, Ergocalciferol, (DRISDOL) 1.25 MG (50000 UNIT) CAPS capsule, Take 50,000 Units by mouth once a week., Disp: , Rfl:    Medications ordered in this encounter:  No orders of the defined types were placed in this encounter.    *If you need refills on other medications prior to your next appointment, please contact your pharmacy*  Follow-Up: Call back or seek an in-person evaluation if the symptoms worsen or if the condition fails to improve as anticipated.  [facility] [phone]  Other  Instructions Take tamiflu  as prescribed.  Can take Tessalon  as needed for cough.  Recommend flonase and mucinex for congestion.  Recommend Delsym for cough.  Can take Tylenol  or Ibuprofen  as needed for body aches and fever.  Ok to be around others once you have been fever free for 24 hours.    If you have been instructed to have an in-person evaluation today at a local Urgent Care facility, please use the link below. It will take you to a list of all of our available [facility], including address, phone number and hours of operation. Please do not delay care.  [facility]  If you or a family member do not have a primary care provider, use the link below to schedule a visit and establish care. When you choose a [facility] primary care physician or advanced practice provider, you gain a long-term partner in health. Find a Primary Care Provider  Learn more about [facility]'s in-office and virtual care options: [facility] - Get Care Now

## 2023-05-30 NOTE — Telephone Encounter (Signed)
 Chief Complaint: Flu exposure Symptoms: cough, body aches, fever Frequency: onset today Pertinent Negatives: Patient denies other symptoms Disposition: [] ED /[] Urgent Care (no appt availability in office) / [] Appointment(In office/virtual)/ [x]  [place] [facility]/ [] Home Care/ [] Refused Recommended Disposition /[] [facility]/ []  Follow-up with PCP Additional Notes: Patient says she's been staying at her son's home with his family all last week until yesterday and they all tested positive for the flu today. She says her symptoms started and she call the pharmacist and was advised to call her doctor to get tamiflu  prescribed. Advised office is closed, offered virtual UC, she agreed, scheduled for 1900 today.   Summary: Medication request   Pt is calling in because she has body aches and a cough and a slight fever and says she was exposed to the flu and wants to know if she can get some medication sent in to [facility] [address]. Pt is requesting a callback     Reason for Disposition  [1] Influenza EXPOSURE (Close Contact) within last 48 hours (2 days) AND [2] exposed person is HIGH RISK (e.g., age > 64 years, pregnant, HIV+, chronic medical condition)  Answer Assessment - Initial Assessment Questions 1. TYPE of EXPOSURE: How were you exposed? (e.g., close contact, not a close contact)     Close contact staying with my son and his family last week and they tested positive today 2. DATE of EXPOSURE: When did the exposure occur? (e.g., hour, days, weeks)     Last week until yesterday 4. HIGH RISK for COMPLICATIONS: Do you have any heart or lung problems? Do you have a weakened immune system? (e.g., CHF, COPD, asthma, HIV positive, chemotherapy, renal failure, diabetes mellitus, sickle cell anemia)     No 5. SYMPTOMS: Do you have any symptoms? (e.g., cough, fever, sore throat, difficulty breathing).     Body aches, cough, fever 101  Protocols used: Influenza (Flu)  Exposure-A-AH

## 2023-06-20 ENCOUNTER — Ambulatory Visit (HOSPITAL_COMMUNITY)
Admission: RE | Admit: 2023-06-20 | Discharge: 2023-06-20 | Disposition: A | Discharge status: Home / Self Care | Payer: Medicaid Other | Source: Ambulatory Visit | Attending: Cardiovascular Disease

## 2023-06-20 DIAGNOSIS — R079 Chest pain, unspecified: Secondary | ICD-10-CM | POA: Insufficient documentation

## 2023-06-20 LAB — ECHOCARDIOGRAM COMPLETE
AR max vel: 1.71 cm2
AV Area VTI: 1.75 cm2
AV Area mean vel: 1.72 cm2
AV Mean grad: 5 mm[Hg]
AV Peak grad: 9.4 mm[Hg]
Ao pk vel: 1.53 m/s
Area-P 1/2: 4.96 cm2
S' Lateral: 3.03 cm

## 2023-06-22 ENCOUNTER — Encounter: Payer: Self-pay | Admitting: Cardiovascular Disease

## 2023-06-24 DIAGNOSIS — I10 Essential (primary) hypertension: Secondary | ICD-10-CM | POA: Diagnosis not present

## 2023-06-24 DIAGNOSIS — E559 Vitamin D deficiency, unspecified: Secondary | ICD-10-CM | POA: Diagnosis not present

## 2023-06-24 DIAGNOSIS — K219 Gastro-esophageal reflux disease without esophagitis: Secondary | ICD-10-CM | POA: Diagnosis not present

## 2023-06-24 DIAGNOSIS — Z79899 Other long term (current) drug therapy: Secondary | ICD-10-CM | POA: Diagnosis not present

## 2023-06-24 DIAGNOSIS — M5489 Other dorsalgia: Secondary | ICD-10-CM | POA: Diagnosis not present

## 2023-06-24 DIAGNOSIS — R7303 Prediabetes: Secondary | ICD-10-CM | POA: Diagnosis not present

## 2023-06-24 DIAGNOSIS — E669 Obesity, unspecified: Secondary | ICD-10-CM | POA: Diagnosis not present

## 2023-06-27 DIAGNOSIS — Z79899 Other long term (current) drug therapy: Secondary | ICD-10-CM | POA: Diagnosis not present

## 2023-06-27 NOTE — Telephone Encounter (Signed)
 Pt returning call to a nurse

## 2023-06-27 NOTE — Telephone Encounter (Signed)
 Called and spoke to patient. Verified name and DOB. Below message relayed to patient. Patient has no questions at this time.    Mrs. Stacy Hatfield:   Great news, your heart ultrasound was normal. There were no abnormalities detected. This indicates your heart is functioning normally.    Please let me know if you have any questions or concerns.    Darryle T. Barbaraann, MD, [facility]  [facility]  [address] [place], [place] [address] [phone]  7:28 PM

## 2023-07-08 ENCOUNTER — Encounter (HOSPITAL_COMMUNITY): Payer: Self-pay

## 2023-07-09 ENCOUNTER — Telehealth (HOSPITAL_COMMUNITY): Payer: Self-pay | Admitting: *Deleted

## 2023-07-09 NOTE — Telephone Encounter (Signed)
 Reaching out to patient to offer assistance regarding upcoming cardiac imaging study; pt verbalizes understanding of appt date/time, parking situation and where to check in, pre-test NPO status and medications ordered, and verified current allergies; name and call back number provided for further questions should they arise Johney Frame RN Navigator Cardiac Imaging [facility] [phone] office [phone] cell

## 2023-07-10 ENCOUNTER — Ambulatory Visit (HOSPITAL_COMMUNITY)
Admission: RE | Admit: 2023-07-10 | Discharge: 2023-07-10 | Disposition: A | Discharge status: Home / Self Care | Payer: Medicaid Other | Source: Ambulatory Visit | Attending: Cardiovascular Disease | Admitting: Cardiovascular Disease

## 2023-07-10 DIAGNOSIS — R079 Chest pain, unspecified: Secondary | ICD-10-CM | POA: Insufficient documentation

## 2023-07-10 MED ORDER — NITROGLYCERIN 0.4 MG SL SUBL
SUBLINGUAL_TABLET | SUBLINGUAL | Status: AC
  Filled 2023-07-10: qty 2

## 2023-07-10 MED ORDER — NITROGLYCERIN 0.4 MG SL SUBL
0.8000 mg | SUBLINGUAL_TABLET | Freq: Once | SUBLINGUAL | Status: AC
  Administered 2023-07-10: 0.8 mg via SUBLINGUAL

## 2023-07-10 MED ORDER — IOHEXOL 350 MG/ML SOLN
100.0000 mL | Freq: Once | INTRAVENOUS | Status: AC | PRN
  Administered 2023-07-10: 100 mL via INTRAVENOUS

## 2023-07-11 ENCOUNTER — Encounter: Payer: Self-pay | Admitting: Cardiovascular Disease

## 2023-07-15 ENCOUNTER — Other Ambulatory Visit: Payer: Self-pay

## 2023-07-15 DIAGNOSIS — E785 Hyperlipidemia, unspecified: Secondary | ICD-10-CM

## 2023-07-15 MED ORDER — ATORVASTATIN CALCIUM 20 MG PO TABS: 20.0000 mg | ORAL_TABLET | Freq: Every day | ORAL | 4 refills | Status: DC

## 2023-07-18 ENCOUNTER — Telehealth: Payer: Self-pay | Admitting: Cardiovascular Disease

## 2023-07-18 NOTE — Telephone Encounter (Signed)
 Pt said Dr Flora Lipps was talking about sending her for sleep test. Pt would like to proceed with this

## 2023-07-21 DIAGNOSIS — Z6832 Body mass index (BMI) 32.0-32.9, adult: Secondary | ICD-10-CM | POA: Diagnosis not present

## 2023-07-21 DIAGNOSIS — R030 Elevated blood-pressure reading, without diagnosis of hypertension: Secondary | ICD-10-CM | POA: Diagnosis not present

## 2023-07-21 DIAGNOSIS — I10 Essential (primary) hypertension: Secondary | ICD-10-CM | POA: Diagnosis not present

## 2023-07-21 DIAGNOSIS — E6609 Other obesity due to excess calories: Secondary | ICD-10-CM | POA: Diagnosis not present

## 2023-07-21 DIAGNOSIS — M5489 Other dorsalgia: Secondary | ICD-10-CM | POA: Diagnosis not present

## 2023-07-21 DIAGNOSIS — Z79899 Other long term (current) drug therapy: Secondary | ICD-10-CM | POA: Diagnosis not present

## 2023-07-24 DIAGNOSIS — Z79899 Other long term (current) drug therapy: Secondary | ICD-10-CM | POA: Diagnosis not present

## 2023-08-19 DIAGNOSIS — M549 Dorsalgia, unspecified: Secondary | ICD-10-CM | POA: Diagnosis not present

## 2023-08-19 DIAGNOSIS — I10 Essential (primary) hypertension: Secondary | ICD-10-CM | POA: Diagnosis not present

## 2023-08-19 DIAGNOSIS — E559 Vitamin D deficiency, unspecified: Secondary | ICD-10-CM | POA: Diagnosis not present

## 2023-08-19 DIAGNOSIS — Z780 Asymptomatic menopausal state: Secondary | ICD-10-CM | POA: Diagnosis not present

## 2023-08-19 DIAGNOSIS — R7303 Prediabetes: Secondary | ICD-10-CM | POA: Diagnosis not present

## 2023-08-19 DIAGNOSIS — G8929 Other chronic pain: Secondary | ICD-10-CM | POA: Diagnosis not present

## 2023-08-19 DIAGNOSIS — E669 Obesity, unspecified: Secondary | ICD-10-CM | POA: Diagnosis not present

## 2023-08-19 DIAGNOSIS — K219 Gastro-esophageal reflux disease without esophagitis: Secondary | ICD-10-CM | POA: Diagnosis not present

## 2023-08-19 DIAGNOSIS — Z79899 Other long term (current) drug therapy: Secondary | ICD-10-CM | POA: Diagnosis not present

## 2023-08-21 DIAGNOSIS — Z79899 Other long term (current) drug therapy: Secondary | ICD-10-CM | POA: Diagnosis not present

## 2023-09-03 DIAGNOSIS — M810 Age-related osteoporosis without current pathological fracture: Secondary | ICD-10-CM | POA: Diagnosis not present

## 2023-09-17 DIAGNOSIS — M85851 Other specified disorders of bone density and structure, right thigh: Secondary | ICD-10-CM | POA: Diagnosis not present

## 2023-09-17 DIAGNOSIS — I10 Essential (primary) hypertension: Secondary | ICD-10-CM | POA: Diagnosis not present

## 2023-09-17 DIAGNOSIS — R7303 Prediabetes: Secondary | ICD-10-CM | POA: Diagnosis not present

## 2023-09-17 DIAGNOSIS — Z79899 Other long term (current) drug therapy: Secondary | ICD-10-CM | POA: Diagnosis not present

## 2023-09-17 DIAGNOSIS — G8929 Other chronic pain: Secondary | ICD-10-CM | POA: Diagnosis not present

## 2023-09-17 DIAGNOSIS — E669 Obesity, unspecified: Secondary | ICD-10-CM | POA: Diagnosis not present

## 2023-09-17 DIAGNOSIS — K219 Gastro-esophageal reflux disease without esophagitis: Secondary | ICD-10-CM | POA: Diagnosis not present

## 2023-09-17 DIAGNOSIS — E559 Vitamin D deficiency, unspecified: Secondary | ICD-10-CM | POA: Diagnosis not present

## 2023-09-17 DIAGNOSIS — M549 Dorsalgia, unspecified: Secondary | ICD-10-CM | POA: Diagnosis not present

## 2023-09-26 DIAGNOSIS — E785 Hyperlipidemia, unspecified: Secondary | ICD-10-CM | POA: Diagnosis not present

## 2023-09-27 LAB — LIPID PANEL
Chol/HDL Ratio: 3.3 ratio (ref 0.0–4.4)
Cholesterol, Total: 153 mg/dL (ref 100–199)
HDL: 47 mg/dL (ref >39)
LDL Chol Calc (NIH): 86 mg/dL (ref 0–99)
Triglycerides: 107 mg/dL (ref 0–149)
VLDL Cholesterol Cal: 20 mg/dL (ref 5–40)

## 2023-09-28 ENCOUNTER — Encounter: Payer: Self-pay | Admitting: Cardiovascular Disease

## 2023-10-01 ENCOUNTER — Ambulatory Visit: Payer: Self-pay

## 2023-10-01 MED ORDER — ATORVASTATIN CALCIUM 40 MG PO TABS: 40.0000 mg | ORAL_TABLET | Freq: Every day | ORAL | 3 refills | Status: AC

## 2023-10-02 NOTE — Progress Notes (Signed)
 Cardiology Office Note:    Date:  10/09/2023   ID:  LACHRISHA Hatfield, DOB 1960/05/23, MRN [id number]  PCP:  Victor Grapes, PA-C  Cardiologist:  Oneil Bigness, MD     Referring MD: Victor Grapes, PA-C   Chief Complaint: follow-up of chest pain and shortness of breath   History of Present Illness:    Stacy Hatfield is a 63 y.o. female with a history of minimal non-obstructive CAD in 06/2023, hypertension, and hyperlipidemia who is followed by Dr. Rolm Clos and presents today for follow-up of chest pain and shortness of breath.  Patient was referred to Dr. Rolm Clos in 05/2023 after recent ED visit in 04/2023 for chest pain. High-sensitivity troponin was negative x2. D-dimer was positive but chest CTA was negative for a PE. She denied any recurrent chest pain since the ED but did report some dyspnea with activity. Echo and coronary CTA were ordered for further evaluation. Echo showed LVEF of 60-65% with normal wall motion and diastolic parameters, normal RV function, and no significant valvular disease. Coronary CTA in 06/2023 showed a coronary calcium  score of 59 (85th percentile for age and sex) and minimal non-obstructive CAD.   She presents today for follow-up. She is doing well since last visit. No recurrent chest pain. No shortness of breath, orthopnea, PND, edema, palpitations, lightheadedness/ dizziness, or syncope. BP is mildly elevated today. She usually takes her Lisinopril  at night but forgot to take it last night. BP usually well controlled at home.  EKGs/Labs/Other Studies Reviewed:    The following studies were reviewed:  Echocardiogram 06/20/2023: Impressions: 1. Left ventricular ejection fraction, by estimation, is 60 to 65%. The  left ventricle has normal function. The left ventricle has no regional  wall motion abnormalities. Left ventricular diastolic parameters were  normal. The average left ventricular  global longitudinal strain is -21.4 %. The global longitudinal strain is   normal.   2. Right ventricular systolic function is normal. The right ventricular  size is normal.   3. The mitral valve is normal in structure. Trivial mitral valve  regurgitation. No evidence of mitral stenosis.   4. The aortic valve is tricuspid. Aortic valve regurgitation is not  visualized. Aortic valve sclerosis/calcification is present, without any  evidence of aortic stenosis. Aortic valve area, by VTI measures 1.75 cm.  Aortic valve mean gradient measures  5.0 mmHg. Aortic valve Vmax measures 1.53 m/s.   5. The inferior vena cava is normal in size with greater than 50%  respiratory variability, suggesting right atrial pressure of 3 mmHg.  _______________  Coronary CTA 07/10/2023: Impressions: 1. Coronary calcium  score of 59. This was 85th percentile for age, sex, and race matched control. Total plaque volume of 36 cubic mm is greater that 25th percentile for age and gender. 2. Normal coronary origin with Right dominance. 3. CAD-RADS 1. Minimal non-obstructive CAD (1-24%). Consider non-atherosclerotic causes of chest pain. Consider preventive therapy and risk factor modification.   EKG:  EKG not ordered today.   Recent Labs: 05/16/2023: BUN 11; Creatinine, Ser 0.59; Hemoglobin 13.5; Platelets 224; Potassium 3.5; Sodium 140  Recent Lipid Panel    Component Value Date/Time   CHOL 153 09/26/2023 0901   TRIG 107 09/26/2023 0901   HDL 47 09/26/2023 0901   CHOLHDL 3.3 09/26/2023 0901   CHOLHDL 4.2 CALC 09/16/2006 0724   VLDL 26 09/16/2006 0724   LDLCALC 86 09/26/2023 0901   LDLDIRECT 153.8 09/16/2006 0724    Physical Exam:    Vital Signs:  BP (!) 146/82   Pulse (!) 58   Ht 5\' 1"  (1.549 m)   Wt 180 lb (81.6 kg)   LMP 10/18/2012   SpO2 96%   BMI 34.01 kg/m     Wt Readings from Last 3 Encounters:  10/09/23 180 lb (81.6 kg)  05/23/23 183 lb (83 kg)  05/16/23 185 lb (83.9 kg)     General: 63 y.o. female in no acute distress. HEENT: Normocephalic and  atraumatic. Sclera clear.  Neck: Supple. No carotid bruits. No JVD. Heart: RRR. Distinct S1 and S2. No murmurs, gallops, or rubs.  Lungs: No increased work of breathing. Clear to ausculation bilaterally. No wheezes, rhonchi, or rales.  Extremities: No lower extremity edema.   Skin: Warm and dry. Neuro: No focal deficits. Psych: Normal affect. Responds appropriately.  Assessment:    1. Coronary artery disease involving native coronary artery of native heart without angina pectoris   2. Primary hypertension   3. Hyperlipidemia, unspecified hyperlipidemia type     Plan:    Non-Obstructive CAD Patient reported one episode of chest pain as well as some dyspnea on exertion at last visit in 05/2023. Coronary CTA showed 59 (85th percentile for age and sex) and minimal non-obstructive CAD.  - No recurrent chest pain.  - No need for aspirin given only minimal disease.  - Continue statin.   Hypertension BP mildly elevated. Initially 142/70 and then 146/82 on my personal recheck at the end of visit. However, she forgot to take her medicine last night. BP usually well controlled at home. - Continue Lisinopril  20mg  daily. She takes this at night. - Asked patient to monitor BP and let us  know if BP consistently >130/80.  Hyperlipidemia Lipid panel in 09/2023: Total Cholesterol 153, Triglycerides 107, HDL 47, LDL 86. LDL goal <70 given CAD. - Lipitor was recently increased to 40mg  daily after most recent labs.. Continue.  - Plan is for repeat labs in about 2 months. This has already been ordered.  Disposition: Follow up in 1 year.   Sable Cram, PA-C  10/09/2023 4:24 PM    [facility]

## 2023-10-08 ENCOUNTER — Ambulatory Visit: Payer: Medicaid Other | Admitting: Student

## 2023-10-08 ENCOUNTER — Ambulatory Visit: Admitting: Student

## 2023-10-09 ENCOUNTER — Ambulatory Visit: Attending: Student | Admitting: Student

## 2023-10-09 ENCOUNTER — Encounter: Payer: Self-pay | Admitting: Student

## 2023-10-09 VITALS — BP 146/82 | HR 58 | Ht 61.0 in | Wt 180.0 lb

## 2023-10-09 DIAGNOSIS — I2510 Atherosclerotic heart disease of native coronary artery without angina pectoris: Secondary | ICD-10-CM | POA: Diagnosis not present

## 2023-10-09 DIAGNOSIS — I10 Essential (primary) hypertension: Secondary | ICD-10-CM | POA: Insufficient documentation

## 2023-10-09 DIAGNOSIS — E785 Hyperlipidemia, unspecified: Secondary | ICD-10-CM | POA: Insufficient documentation

## 2023-10-09 NOTE — Patient Instructions (Signed)
 Medication Instructions:  The current medical regimen is effective;  continue present plan and medications as directed. Please refer to the Current Medication list given to you today.  *If you need a refill on your cardiac medications before your next appointment, please call your pharmacy*  Lab Work: FASTING LIPID IN 2 MONTHS If you have labs (blood work) drawn today and your tests are completely normal, you will receive your results only by: MyChart Message (if you have MyChart) OR A paper copy in the mail If you have any lab test that is abnormal or we need to change your treatment, we will call you to review the results.  Testing/Procedures: NONE  Follow-Up: At [facility], you and your health needs are our priority.  As part of our continuing mission to provide you with exceptional heart care, our providers are all part of one team.  This team includes your primary Cardiologist (physician) and Advanced Practice Providers or APPs (Physician Assistants and Nurse Practitioners) who all work together to provide you with the care you need, when you need it.  Your next appointment:   12 month(s)  Provider:   Oneil Bigness, MD

## 2023-10-15 DIAGNOSIS — K219 Gastro-esophageal reflux disease without esophagitis: Secondary | ICD-10-CM | POA: Diagnosis not present

## 2023-10-15 DIAGNOSIS — M549 Dorsalgia, unspecified: Secondary | ICD-10-CM | POA: Diagnosis not present

## 2023-10-15 DIAGNOSIS — E559 Vitamin D deficiency, unspecified: Secondary | ICD-10-CM | POA: Diagnosis not present

## 2023-10-15 DIAGNOSIS — Z79899 Other long term (current) drug therapy: Secondary | ICD-10-CM | POA: Diagnosis not present

## 2023-10-15 DIAGNOSIS — E669 Obesity, unspecified: Secondary | ICD-10-CM | POA: Diagnosis not present

## 2023-10-15 DIAGNOSIS — G8929 Other chronic pain: Secondary | ICD-10-CM | POA: Diagnosis not present

## 2023-10-15 DIAGNOSIS — M85851 Other specified disorders of bone density and structure, right thigh: Secondary | ICD-10-CM | POA: Diagnosis not present

## 2023-10-15 DIAGNOSIS — R7303 Prediabetes: Secondary | ICD-10-CM | POA: Diagnosis not present

## 2023-10-15 DIAGNOSIS — I10 Essential (primary) hypertension: Secondary | ICD-10-CM | POA: Diagnosis not present

## 2023-11-12 DIAGNOSIS — G8929 Other chronic pain: Secondary | ICD-10-CM | POA: Diagnosis not present

## 2023-11-12 DIAGNOSIS — I10 Essential (primary) hypertension: Secondary | ICD-10-CM | POA: Diagnosis not present

## 2023-11-12 DIAGNOSIS — M85851 Other specified disorders of bone density and structure, right thigh: Secondary | ICD-10-CM | POA: Diagnosis not present

## 2023-11-12 DIAGNOSIS — E559 Vitamin D deficiency, unspecified: Secondary | ICD-10-CM | POA: Diagnosis not present

## 2023-11-12 DIAGNOSIS — Z79899 Other long term (current) drug therapy: Secondary | ICD-10-CM | POA: Diagnosis not present

## 2023-11-12 DIAGNOSIS — M549 Dorsalgia, unspecified: Secondary | ICD-10-CM | POA: Diagnosis not present

## 2023-11-12 DIAGNOSIS — R7303 Prediabetes: Secondary | ICD-10-CM | POA: Diagnosis not present

## 2023-11-12 DIAGNOSIS — E669 Obesity, unspecified: Secondary | ICD-10-CM | POA: Diagnosis not present

## 2023-11-12 DIAGNOSIS — K219 Gastro-esophageal reflux disease without esophagitis: Secondary | ICD-10-CM | POA: Diagnosis not present

## 2023-11-19 DIAGNOSIS — H1045 Other chronic allergic conjunctivitis: Secondary | ICD-10-CM | POA: Diagnosis not present

## 2023-11-19 DIAGNOSIS — H2513 Age-related nuclear cataract, bilateral: Secondary | ICD-10-CM | POA: Diagnosis not present

## 2023-11-19 DIAGNOSIS — H40023 Open angle with borderline findings, high risk, bilateral: Secondary | ICD-10-CM | POA: Diagnosis not present

## 2023-11-19 DIAGNOSIS — H35033 Hypertensive retinopathy, bilateral: Secondary | ICD-10-CM | POA: Diagnosis not present

## 2023-11-19 DIAGNOSIS — H3589 Other specified retinal disorders: Secondary | ICD-10-CM | POA: Diagnosis not present

## 2023-11-19 DIAGNOSIS — H47292 Other optic atrophy, left eye: Secondary | ICD-10-CM | POA: Diagnosis not present

## 2023-12-09 DIAGNOSIS — M25511 Pain in right shoulder: Secondary | ICD-10-CM | POA: Diagnosis not present

## 2023-12-11 DIAGNOSIS — M549 Dorsalgia, unspecified: Secondary | ICD-10-CM | POA: Diagnosis not present

## 2023-12-11 DIAGNOSIS — E669 Obesity, unspecified: Secondary | ICD-10-CM | POA: Diagnosis not present

## 2023-12-11 DIAGNOSIS — E6609 Other obesity due to excess calories: Secondary | ICD-10-CM | POA: Diagnosis not present

## 2023-12-11 DIAGNOSIS — Z79899 Other long term (current) drug therapy: Secondary | ICD-10-CM | POA: Diagnosis not present

## 2023-12-11 DIAGNOSIS — I10 Essential (primary) hypertension: Secondary | ICD-10-CM | POA: Diagnosis not present

## 2023-12-15 DIAGNOSIS — Z79899 Other long term (current) drug therapy: Secondary | ICD-10-CM | POA: Diagnosis not present

## 2024-01-09 DIAGNOSIS — E559 Vitamin D deficiency, unspecified: Secondary | ICD-10-CM | POA: Diagnosis not present

## 2024-01-09 DIAGNOSIS — Z79899 Other long term (current) drug therapy: Secondary | ICD-10-CM | POA: Diagnosis not present

## 2024-01-09 DIAGNOSIS — M85851 Other specified disorders of bone density and structure, right thigh: Secondary | ICD-10-CM | POA: Diagnosis not present

## 2024-01-09 DIAGNOSIS — M549 Dorsalgia, unspecified: Secondary | ICD-10-CM | POA: Diagnosis not present

## 2024-01-09 DIAGNOSIS — G4733 Obstructive sleep apnea (adult) (pediatric): Secondary | ICD-10-CM | POA: Diagnosis not present

## 2024-01-09 DIAGNOSIS — M129 Arthropathy, unspecified: Secondary | ICD-10-CM | POA: Diagnosis not present

## 2024-01-09 DIAGNOSIS — E669 Obesity, unspecified: Secondary | ICD-10-CM | POA: Diagnosis not present

## 2024-01-09 DIAGNOSIS — R7303 Prediabetes: Secondary | ICD-10-CM | POA: Diagnosis not present

## 2024-01-09 DIAGNOSIS — I10 Essential (primary) hypertension: Secondary | ICD-10-CM | POA: Diagnosis not present

## 2024-01-09 DIAGNOSIS — K219 Gastro-esophageal reflux disease without esophagitis: Secondary | ICD-10-CM | POA: Diagnosis not present

## 2024-01-09 DIAGNOSIS — Z1211 Encounter for screening for malignant neoplasm of colon: Secondary | ICD-10-CM | POA: Diagnosis not present

## 2024-02-04 DIAGNOSIS — E559 Vitamin D deficiency, unspecified: Secondary | ICD-10-CM | POA: Diagnosis not present

## 2024-02-04 DIAGNOSIS — K219 Gastro-esophageal reflux disease without esophagitis: Secondary | ICD-10-CM | POA: Diagnosis not present

## 2024-02-04 DIAGNOSIS — M549 Dorsalgia, unspecified: Secondary | ICD-10-CM | POA: Diagnosis not present

## 2024-02-04 DIAGNOSIS — I10 Essential (primary) hypertension: Secondary | ICD-10-CM | POA: Diagnosis not present

## 2024-02-04 DIAGNOSIS — E118 Type 2 diabetes mellitus with unspecified complications: Secondary | ICD-10-CM | POA: Diagnosis not present

## 2024-02-04 DIAGNOSIS — E669 Obesity, unspecified: Secondary | ICD-10-CM | POA: Diagnosis not present

## 2024-02-04 DIAGNOSIS — Z79899 Other long term (current) drug therapy: Secondary | ICD-10-CM | POA: Diagnosis not present

## 2024-02-04 DIAGNOSIS — Z23 Encounter for immunization: Secondary | ICD-10-CM | POA: Diagnosis not present

## 2024-02-04 DIAGNOSIS — M85851 Other specified disorders of bone density and structure, right thigh: Secondary | ICD-10-CM | POA: Diagnosis not present

## 2024-02-09 DIAGNOSIS — Z79899 Other long term (current) drug therapy: Secondary | ICD-10-CM | POA: Diagnosis not present

## 2024-02-11 ENCOUNTER — Other Ambulatory Visit: Payer: Self-pay | Admitting: Physician Assistant

## 2024-02-11 DIAGNOSIS — Z1231 Encounter for screening mammogram for malignant neoplasm of breast: Secondary | ICD-10-CM

## 2024-03-05 DIAGNOSIS — I10 Essential (primary) hypertension: Secondary | ICD-10-CM | POA: Diagnosis not present

## 2024-03-05 DIAGNOSIS — K219 Gastro-esophageal reflux disease without esophagitis: Secondary | ICD-10-CM | POA: Diagnosis not present

## 2024-03-05 DIAGNOSIS — E559 Vitamin D deficiency, unspecified: Secondary | ICD-10-CM | POA: Diagnosis not present

## 2024-03-05 DIAGNOSIS — E669 Obesity, unspecified: Secondary | ICD-10-CM | POA: Diagnosis not present

## 2024-03-05 DIAGNOSIS — Z79899 Other long term (current) drug therapy: Secondary | ICD-10-CM | POA: Diagnosis not present

## 2024-03-05 DIAGNOSIS — E118 Type 2 diabetes mellitus with unspecified complications: Secondary | ICD-10-CM | POA: Diagnosis not present

## 2024-03-05 DIAGNOSIS — M549 Dorsalgia, unspecified: Secondary | ICD-10-CM | POA: Diagnosis not present

## 2024-03-05 DIAGNOSIS — M85851 Other specified disorders of bone density and structure, right thigh: Secondary | ICD-10-CM | POA: Diagnosis not present

## 2024-03-08 ENCOUNTER — Ambulatory Visit
Admission: RE | Admit: 2024-03-08 | Discharge: 2024-03-08 | Disposition: A | Discharge status: Home / Self Care | Source: Ambulatory Visit | Attending: Physician Assistant | Admitting: Physician Assistant

## 2024-03-08 DIAGNOSIS — Z1231 Encounter for screening mammogram for malignant neoplasm of breast: Secondary | ICD-10-CM

## 2024-04-02 DIAGNOSIS — M549 Dorsalgia, unspecified: Secondary | ICD-10-CM | POA: Diagnosis not present

## 2024-04-02 DIAGNOSIS — E6609 Other obesity due to excess calories: Secondary | ICD-10-CM | POA: Diagnosis not present

## 2024-04-02 DIAGNOSIS — Z6832 Body mass index (BMI) 32.0-32.9, adult: Secondary | ICD-10-CM | POA: Diagnosis not present

## 2024-04-02 DIAGNOSIS — E669 Obesity, unspecified: Secondary | ICD-10-CM | POA: Diagnosis not present

## 2024-04-02 DIAGNOSIS — Z79899 Other long term (current) drug therapy: Secondary | ICD-10-CM | POA: Diagnosis not present

## 2024-04-09 ENCOUNTER — Ambulatory Visit: Admitting: *Deleted

## 2024-04-09 VITALS — Ht 61.0 in | Wt 185.0 lb

## 2024-04-09 DIAGNOSIS — Z1211 Encounter for screening for malignant neoplasm of colon: Secondary | ICD-10-CM

## 2024-04-09 MED ORDER — PEG 3350-KCL-NA BICARB-NACL 420 G PO SOLR: 4000.0000 mL | Freq: Once | ORAL | 0 refills | Status: AC

## 2024-04-09 NOTE — Progress Notes (Signed)
 Pre visit completed over telephone. Instructions through MyChart, secure email, and mailed to the home address   No issues known to pt with past sedation with any surgeries or procedures Patient denies ever being told they had issues or difficulty with intubation  No FH of Malignant Hyperthermia Pt is not on diet pills Pt is not on home 02  Pt is not on blood thinners  Pt denies issues with constipation  No A fib or A flutter Have any cardiac testing pending-- NO Pt instructed to use [email or web address].com or GoodRx for a price reduction on prep    Chart previous reviewed by DOROTHA Schillings, CRNA

## 2024-04-23 ENCOUNTER — Encounter: Payer: Self-pay | Admitting: Gastroenterology

## 2024-04-23 ENCOUNTER — Ambulatory Visit: Admitting: Gastroenterology

## 2024-04-23 VITALS — BP 139/77 | HR 79 | Temp 97.3°F | Resp 22 | Ht 61.0 in | Wt 185.0 lb

## 2024-04-23 DIAGNOSIS — D125 Benign neoplasm of sigmoid colon: Secondary | ICD-10-CM

## 2024-04-23 DIAGNOSIS — I10 Essential (primary) hypertension: Secondary | ICD-10-CM | POA: Diagnosis not present

## 2024-04-23 DIAGNOSIS — E785 Hyperlipidemia, unspecified: Secondary | ICD-10-CM | POA: Diagnosis not present

## 2024-04-23 DIAGNOSIS — K635 Polyp of colon: Secondary | ICD-10-CM

## 2024-04-23 DIAGNOSIS — Z1211 Encounter for screening for malignant neoplasm of colon: Secondary | ICD-10-CM | POA: Diagnosis not present

## 2024-04-23 DIAGNOSIS — D128 Benign neoplasm of rectum: Secondary | ICD-10-CM | POA: Diagnosis not present

## 2024-04-23 MED ORDER — SODIUM CHLORIDE 0.9 % IV SOLN: 500.0000 mL | Freq: Once | INTRAVENOUS | Status: AC

## 2024-04-23 NOTE — Patient Instructions (Signed)
Await pathology results.  Handout on polyps provided.  YOU HAD AN ENDOSCOPIC PROCEDURE TODAY AT THE [facility]:   Refer to the procedure report that was given to you for any specific questions about what was found during the examination.  If the procedure report does not answer your questions, please call your gastroenterologist to clarify.  If you requested that your care partner not be given the details of your procedure findings, then the procedure report has been included in a sealed envelope for you to review at your convenience later.  YOU SHOULD EXPECT: Some feelings of bloating in the abdomen. Passage of more gas than usual.  Walking can help get rid of the air that was put into your GI tract during the procedure and reduce the bloating. If you had a lower endoscopy (such as a colonoscopy or flexible sigmoidoscopy) you may notice spotting of blood in your stool or on the toilet paper. If you underwent a bowel prep for your procedure, you may not have a normal bowel movement for a few days.  Please Note:  You might notice some irritation and congestion in your nose or some drainage.  This is from the oxygen used during your procedure.  There is no need for concern and it should clear up in a day or so.  SYMPTOMS TO REPORT IMMEDIATELY:  Following lower endoscopy (colonoscopy or flexible sigmoidoscopy):  Excessive amounts of blood in the stool  Significant tenderness or worsening of abdominal pains  Swelling of the abdomen that is new, acute  Fever of 100F or higher   For urgent or emergent issues, a gastroenterologist can be reached at any hour by calling [phone]. Do not use MyChart messaging for urgent concerns.    DIET:  We do recommend a small meal at first, but then you may proceed to your regular diet.  Drink plenty of fluids but you should avoid alcoholic beverages for 24 hours.  ACTIVITY:  You should plan to take it easy for the rest of today and you should  NOT DRIVE or use heavy machinery until tomorrow (because of the sedation medicines used during the test).    FOLLOW UP: Our staff will call the number listed on your records the next business day following your procedure.  We will call around 7:15- 8:00 am to check on you and address any questions or concerns that you may have regarding the information given to you following your procedure. If we do not reach you, we will leave a message.     If any biopsies were taken you will be contacted by phone or by letter within the next 1-3 weeks.  Please call us at [phone] if you have not heard about the biopsies in 3 weeks.    SIGNATURES/CONFIDENTIALITY: You and/or your care partner have signed paperwork which will be entered into your electronic medical record.  These signatures attest to the fact that that the information above on your After Visit Summary has been reviewed and is understood.  Full responsibility of the confidentiality of this discharge information lies with you and/or your care-partner.  

## 2024-04-23 NOTE — Progress Notes (Signed)
 Sedate, gd SR, tolerated procedure well, VSS, report to RN

## 2024-04-23 NOTE — Progress Notes (Signed)
 GASTROENTEROLOGY PROCEDURE H&P NOTE   Primary Care Physician: Darra Hamilton, PA-C    Reason for Procedure:  Colon Cancer screening  Plan:    Colonoscopy  Patient is appropriate for endoscopic procedure(s) in the ambulatory ([facility]) setting.  The nature of the procedure, as well as the risks, benefits, and alternatives were carefully and thoroughly reviewed with the patient. Ample time for discussion and questions allowed. The patient understood, was satisfied, and agreed to proceed. I personally addressed all patient questions and concerns.     HPI: Stacy Hatfield is a 64 y.o. female who presents for colonoscopy for routine Colon Cancer screening.  No active GI symptoms.  No known family history of colon cancer or related malignancy.  Patient is otherwise without complaints or active issues today.  Past Medical History:  Diagnosis Date   Hyperlipidemia    Hypertension     Past Surgical History:  Procedure Laterality Date   none      Prior to Admission medications   Medication Sig Start Date End Date Taking? Authorizing Provider  atorvastatin  (LIPITOR) 40 MG tablet Take 1 tablet (40 mg total) by mouth daily. 10/01/23  Yes O'Neal, Darryle Ned, MD  brimonidine (ALPHAGAN) 0.2 % ophthalmic solution Place 1 drop into the left eye 2 (two) times daily. 06/01/23  Yes [provider]  HYDROcodone -acetaminophen  (NORCO) 10-325 MG tablet Take 1 tablet by mouth 3 times daily as needed for pain 07/16/22  Yes   lisinopril  (ZESTRIL ) 20 MG tablet Take 1 tablet (20 mg total) by mouth daily. Please fill as a 90 day supply 10/19/21  Yes Fleming, Zelda W, NP  Azelastine HCl 0.15 % SOLN     [provider]  Vitamin D, Ergocalciferol, (DRISDOL) 1.25 MG (50000 UNIT) CAPS capsule Take 50,000 Units by mouth once a week. 05/20/23   [provider]    Current Outpatient Medications  Medication Sig Dispense Refill   atorvastatin  (LIPITOR) 40 MG tablet Take 1 tablet (40 mg total)  by mouth daily. 90 tablet 3   brimonidine (ALPHAGAN) 0.2 % ophthalmic solution Place 1 drop into the left eye 2 (two) times daily.     HYDROcodone -acetaminophen  (NORCO) 10-325 MG tablet Take 1 tablet by mouth 3 times daily as needed for pain 90 tablet 0   lisinopril  (ZESTRIL ) 20 MG tablet Take 1 tablet (20 mg total) by mouth daily. Please fill as a 90 day supply 90 tablet 3   Azelastine HCl 0.15 % SOLN      Vitamin D, Ergocalciferol, (DRISDOL) 1.25 MG (50000 UNIT) CAPS capsule Take 50,000 Units by mouth once a week.     Current Facility-Administered Medications  Medication Dose Route Frequency Provider Last Rate Last Admin   0.9 %  sodium chloride  infusion  500 mL Intravenous Once Seven Marengo V, DO        Allergies as of 04/23/2024   (No Known Allergies)    Family History  Problem Relation Age of Onset   Hypertension Mother    Stroke Father    Heart attack Father    Breast cancer Neg Hx     Social History   Socioeconomic History   Marital status: Single    Spouse name: Not on file   Number of children: 1   Years of education: Not on file   Highest education level: Not on file  Occupational History   Occupation: Cleaning houses  Tobacco Use   Smoking status: Former    Current packs/day: 0.00  Types: Cigarettes    Quit date: 2007    Years since quitting: 18.9   Smokeless tobacco: Never  Vaping Use   Vaping status: Never Used  Substance and Sexual Activity   Alcohol use: No   Drug use: No   Sexual activity: Not Currently  Other Topics Concern   Not on file  Social History Narrative   Not on file   Social Drivers of Health   Financial Resource Strain: Not on file  Food Insecurity: No Food Insecurity (11/06/2021)   Hunger Vital Sign    Worried About Running Out of Food in the Last Year: Never true    Ran Out of Food in the Last Year: Never true  Transportation Needs: No Transportation Needs (11/06/2021)   PRAPARE - Administrator, Civil Service  (Medical): No    Lack of Transportation (Non-Medical): No  Physical Activity: Not on file  Stress: Not on file  Social Connections: Not on file  Intimate Partner Violence: Not on file    Physical Exam: Vital signs in last 24 hours: @BP  135/71   Pulse 62   Temp (!) 97.3 F (36.3 C)   Resp 13   Ht 5' 1 (1.549 m)   Wt 185 lb (83.9 kg)   LMP 10/18/2012   SpO2 96%   BMI 34.96 kg/m  GEN: NAD EYE: Sclerae anicteric ENT: MMM CV: Non-tachycardic Pulm: CTA b/l GI: Soft, NT/ND NEURO:  Alert & Oriented x 3   Sandor Flatter, DO [facility]   04/23/2024 10:20 AM

## 2024-04-23 NOTE — Op Note (Signed)
 [facility] Patient Name: Stacy Hatfield Procedure Date: 04/23/2024 10:18 AM MRN: [id number] Endoscopist: Sandor Flatter , MD, [id number] Age: 63 Referring MD:  Date of Birth: 07/12/1960 Gender: Female Account #: [id number] Procedure:                Colonoscopy Indications:              Screening for colorectal malignant neoplasm, This                            is the patient's first colonoscopy Medicines:                Monitored Anesthesia Care Procedure:                Pre-Anesthesia Assessment:                           - Prior to the procedure, a History and Physical                            was performed, and patient medications and                            allergies were reviewed. The patient's tolerance of                            previous anesthesia was also reviewed. The risks                            and benefits of the procedure and the sedation                            options and risks were discussed with the patient.                            All questions were answered, and informed consent                            was obtained. Prior Anticoagulants: The patient has                            taken no anticoagulant or antiplatelet agents. ASA                            Grade Assessment: II - A patient with mild systemic                            disease. After reviewing the risks and benefits,                            the patient was deemed in satisfactory condition to                            undergo the procedure.  After obtaining informed consent, the colonoscope                            was passed under direct vision. Throughout the                            procedure, the patient's blood pressure, pulse, and                            oxygen saturations were monitored continuously. The                            Olympus Scope SN: [id number] was introduced through                            the anus and advanced  to the the terminal ileum.                            The colonoscopy was performed without difficulty.                            The patient tolerated the procedure well. The                            quality of the bowel preparation was good. The                            terminal ileum, ileocecal valve, appendiceal                            orifice, and rectum were photographed. Scope In: 10:28:20 AM Scope Out: 10:54:19 AM Scope Withdrawal Time: 0 hours 20 minutes 21 seconds  Total Procedure Duration: 0 hours 25 minutes 59 seconds  Findings:                 The perianal and digital rectal examinations were                            normal.                           Five sessile polyps were found in the rectum (1)                            and sigmoid colon (4). The polyps were 2 to 4 mm in                            size. These polyps were removed with a cold snare.                            Resection and retrieval were complete. Estimated                            blood loss was minimal.  The retroflexed view of the distal rectum and anal                            verge was normal and showed no anal or rectal                            abnormalities.                           The terminal ileum appeared normal. Complications:            No immediate complications. Estimated Blood Loss:     Estimated blood loss was minimal. Impression:               - Five 2 to 4 mm polyps in the rectum and in the                            sigmoid colon, removed with a cold snare. Resected                            and retrieved.                           - The remainder of the colon was otherwise normal                            appearing.                           - The distal rectum and anal verge are normal on                            retroflexion view.                           - The examined portion of the ileum was normal. Recommendation:           -  Patient has a contact number available for                            emergencies. The signs and symptoms of potential                            delayed complications were discussed with the                            patient. Return to normal activities tomorrow.                            Written discharge instructions were provided to the                            patient.                           - Resume previous diet.                           -  Continue present medications.                           - Await pathology results.                           - Repeat colonoscopy for surveillance based on                            pathology results.                           - Return to GI office PRN. Sandor Flatter, MD 04/23/2024 11:00:21 AM

## 2024-04-23 NOTE — Progress Notes (Signed)
 Vital signs prior to 1000 please disregard wrong patient

## 2024-04-23 NOTE — Progress Notes (Signed)
 Called to room to assist during endoscopic procedure.  Patient ID and intended procedure confirmed with present staff. Received instructions for my participation in the procedure from the performing physician.

## 2024-04-26 ENCOUNTER — Telehealth: Payer: Self-pay | Admitting: Lactation Services

## 2024-04-26 NOTE — Telephone Encounter (Signed)
 No answer left voice mail

## 2024-04-27 LAB — SURGICAL PATHOLOGY

## 2024-05-03 ENCOUNTER — Ambulatory Visit: Payer: Self-pay | Admitting: Gastroenterology

## 2024-05-03 DIAGNOSIS — E669 Obesity, unspecified: Secondary | ICD-10-CM | POA: Diagnosis not present

## 2024-05-03 DIAGNOSIS — M85851 Other specified disorders of bone density and structure, right thigh: Secondary | ICD-10-CM | POA: Diagnosis not present

## 2024-05-03 DIAGNOSIS — E559 Vitamin D deficiency, unspecified: Secondary | ICD-10-CM | POA: Diagnosis not present

## 2024-05-03 DIAGNOSIS — M549 Dorsalgia, unspecified: Secondary | ICD-10-CM | POA: Diagnosis not present

## 2024-05-03 DIAGNOSIS — E118 Type 2 diabetes mellitus with unspecified complications: Secondary | ICD-10-CM | POA: Diagnosis not present

## 2024-05-03 DIAGNOSIS — Z79899 Other long term (current) drug therapy: Secondary | ICD-10-CM | POA: Diagnosis not present

## 2024-05-03 DIAGNOSIS — I10 Essential (primary) hypertension: Secondary | ICD-10-CM | POA: Diagnosis not present

## 2024-05-03 DIAGNOSIS — K219 Gastro-esophageal reflux disease without esophagitis: Secondary | ICD-10-CM | POA: Diagnosis not present

## 2024-07-15 ENCOUNTER — Encounter: Payer: Self-pay | Admitting: Nurse Practitioner
